# Patient Record
Sex: Male | Born: 1945 | Race: White | Hispanic: No | Smoking: Never smoker
Health system: Southern US, Community
[De-identification: ages and names within clinical notes are randomized; demographics above are authoritative.]

## PROBLEM LIST (undated history)

## (undated) DIAGNOSIS — R51 Headache: Secondary | ICD-10-CM

## (undated) DIAGNOSIS — M549 Dorsalgia, unspecified: Secondary | ICD-10-CM

## (undated) DIAGNOSIS — H548 Legal blindness, as defined in USA: Secondary | ICD-10-CM

## (undated) DIAGNOSIS — D496 Neoplasm of unspecified behavior of brain: Secondary | ICD-10-CM

## (undated) DIAGNOSIS — G8929 Other chronic pain: Secondary | ICD-10-CM

## (undated) DIAGNOSIS — I10 Essential (primary) hypertension: Secondary | ICD-10-CM

## (undated) DIAGNOSIS — D4959 Neoplasm of unspecified behavior of other genitourinary organ: Secondary | ICD-10-CM

## (undated) HISTORY — PX: BRAIN SURGERY: SHX531

## (undated) HISTORY — PX: TONSILLECTOMY: SUR1361

## (undated) HISTORY — PX: EYE SURGERY: SHX253

---

## 2004-04-20 ENCOUNTER — Ambulatory Visit (HOSPITAL_COMMUNITY): Admission: RE | Admit: 2004-04-20 | Discharge: 2004-04-20 | Payer: Self-pay | Admitting: Pulmonary Disease

## 2005-10-24 ENCOUNTER — Ambulatory Visit (HOSPITAL_COMMUNITY): Admission: RE | Admit: 2005-10-24 | Discharge: 2005-10-24 | Payer: Self-pay | Admitting: Pulmonary Disease

## 2006-10-27 ENCOUNTER — Ambulatory Visit (HOSPITAL_COMMUNITY): Admission: RE | Admit: 2006-10-27 | Discharge: 2006-10-27 | Payer: Self-pay | Admitting: Pulmonary Disease

## 2006-11-24 ENCOUNTER — Ambulatory Visit (HOSPITAL_COMMUNITY): Admission: RE | Admit: 2006-11-24 | Discharge: 2006-11-24 | Payer: Self-pay | Admitting: Pulmonary Disease

## 2008-11-09 ENCOUNTER — Ambulatory Visit (HOSPITAL_COMMUNITY): Admission: RE | Admit: 2008-11-09 | Discharge: 2008-11-09 | Payer: Self-pay | Admitting: Pulmonary Disease

## 2009-12-11 ENCOUNTER — Ambulatory Visit (HOSPITAL_COMMUNITY): Admission: RE | Admit: 2009-12-11 | Discharge: 2009-12-11 | Payer: Self-pay | Admitting: Family Medicine

## 2009-12-21 ENCOUNTER — Ambulatory Visit (HOSPITAL_COMMUNITY): Admission: RE | Admit: 2009-12-21 | Discharge: 2009-12-21 | Payer: Self-pay | Admitting: Family Medicine

## 2009-12-22 ENCOUNTER — Emergency Department (HOSPITAL_COMMUNITY): Admission: EM | Admit: 2009-12-22 | Discharge: 2009-12-22 | Payer: Self-pay | Admitting: Orthopedic Surgery

## 2009-12-25 ENCOUNTER — Ambulatory Visit (HOSPITAL_COMMUNITY): Admission: RE | Admit: 2009-12-25 | Discharge: 2009-12-25 | Payer: Self-pay | Admitting: Family Medicine

## 2010-02-22 ENCOUNTER — Emergency Department (HOSPITAL_COMMUNITY): Admission: EM | Admit: 2010-02-22 | Discharge: 2010-02-22 | Payer: Self-pay | Admitting: Emergency Medicine

## 2010-03-03 ENCOUNTER — Emergency Department (HOSPITAL_COMMUNITY): Admission: EM | Admit: 2010-03-03 | Discharge: 2010-03-03 | Payer: Self-pay | Admitting: Emergency Medicine

## 2010-06-12 ENCOUNTER — Ambulatory Visit: Admission: RE | Admit: 2010-06-12 | Discharge: 2010-07-19 | Payer: Self-pay | Admitting: Radiation Oncology

## 2010-11-18 ENCOUNTER — Encounter: Payer: Self-pay | Admitting: Pulmonary Disease

## 2011-01-15 LAB — CBC
HCT: 40.8 % (ref 39.0–52.0)
Hemoglobin: 14.5 g/dL (ref 13.0–17.0)
RBC: 4.49 MIL/uL (ref 4.22–5.81)
WBC: 10.2 10*3/uL (ref 4.0–10.5)

## 2011-01-15 LAB — BASIC METABOLIC PANEL
Chloride: 106 mEq/L (ref 96–112)
GFR calc non Af Amer: >60 mL/min (ref >60)
Potassium: 3.2 mEq/L — ABNORMAL LOW (ref 3.5–5.1)
Sodium: 138 mEq/L (ref 135–145)

## 2011-01-15 LAB — DIFFERENTIAL
Eosinophils Relative: 1 % (ref 0–5)
Lymphocytes Relative: 38 % (ref 12–46)
Lymphs Abs: 3.9 10*3/uL (ref 0.7–4.0)
Monocytes Absolute: 0.7 10*3/uL (ref 0.1–1.0)
Monocytes Relative: 7 % (ref 3–12)

## 2011-01-15 LAB — GLUCOSE, CAPILLARY: Glucose-Capillary: 111 mg/dL — ABNORMAL HIGH (ref 70–99)

## 2011-03-03 ENCOUNTER — Emergency Department (HOSPITAL_COMMUNITY)
Admission: EM | Admit: 2011-03-03 | Discharge: 2011-03-03 | Disposition: A | Discharge status: Home / Self Care | Payer: Medicare Other | Attending: Emergency Medicine | Admitting: Emergency Medicine

## 2011-03-03 DIAGNOSIS — E119 Type 2 diabetes mellitus without complications: Secondary | ICD-10-CM | POA: Insufficient documentation

## 2011-03-03 DIAGNOSIS — W57XXXA Bitten or stung by nonvenomous insect and other nonvenomous arthropods, initial encounter: Secondary | ICD-10-CM | POA: Insufficient documentation

## 2011-03-03 DIAGNOSIS — S60469A Insect bite (nonvenomous) of unspecified finger, initial encounter: Secondary | ICD-10-CM | POA: Insufficient documentation

## 2011-03-03 DIAGNOSIS — H548 Legal blindness, as defined in USA: Secondary | ICD-10-CM | POA: Insufficient documentation

## 2011-03-03 DIAGNOSIS — E7800 Pure hypercholesterolemia, unspecified: Secondary | ICD-10-CM | POA: Insufficient documentation

## 2011-03-03 DIAGNOSIS — Z79899 Other long term (current) drug therapy: Secondary | ICD-10-CM | POA: Insufficient documentation

## 2011-03-03 DIAGNOSIS — Z970 Presence of artificial eye: Secondary | ICD-10-CM | POA: Insufficient documentation

## 2011-04-22 ENCOUNTER — Other Ambulatory Visit (HOSPITAL_COMMUNITY): Payer: Self-pay | Admitting: Family Medicine

## 2011-04-22 DIAGNOSIS — M79661 Pain in right lower leg: Secondary | ICD-10-CM

## 2011-04-23 ENCOUNTER — Ambulatory Visit (HOSPITAL_COMMUNITY): Payer: Self-pay

## 2011-04-23 ENCOUNTER — Ambulatory Visit (HOSPITAL_COMMUNITY)
Admission: RE | Admit: 2011-04-23 | Discharge: 2011-04-23 | Disposition: A | Discharge status: Home / Self Care | Payer: Medicare Other | Source: Ambulatory Visit | Attending: Family Medicine | Admitting: Family Medicine

## 2011-04-23 DIAGNOSIS — M79661 Pain in right lower leg: Secondary | ICD-10-CM

## 2011-05-09 ENCOUNTER — Encounter: Payer: Self-pay | Admitting: *Deleted

## 2011-05-09 ENCOUNTER — Emergency Department (HOSPITAL_COMMUNITY)
Admission: EM | Admit: 2011-05-09 | Discharge: 2011-05-09 | Disposition: A | Discharge status: Home / Self Care | Payer: Medicare Other | Attending: Emergency Medicine | Admitting: Emergency Medicine

## 2011-05-09 DIAGNOSIS — M542 Cervicalgia: Secondary | ICD-10-CM | POA: Insufficient documentation

## 2011-05-09 DIAGNOSIS — R51 Headache: Secondary | ICD-10-CM | POA: Insufficient documentation

## 2011-05-09 DIAGNOSIS — D4959 Neoplasm of unspecified behavior of other genitourinary organ: Secondary | ICD-10-CM | POA: Insufficient documentation

## 2011-05-09 DIAGNOSIS — E119 Type 2 diabetes mellitus without complications: Secondary | ICD-10-CM | POA: Insufficient documentation

## 2011-05-09 DIAGNOSIS — I10 Essential (primary) hypertension: Secondary | ICD-10-CM | POA: Insufficient documentation

## 2011-05-09 DIAGNOSIS — D496 Neoplasm of unspecified behavior of brain: Secondary | ICD-10-CM | POA: Insufficient documentation

## 2011-05-09 DIAGNOSIS — G8929 Other chronic pain: Secondary | ICD-10-CM | POA: Insufficient documentation

## 2011-05-09 HISTORY — DX: Neoplasm of unspecified behavior of other genitourinary organ: D49.59

## 2011-05-09 HISTORY — DX: Other chronic pain: G89.29

## 2011-05-09 HISTORY — DX: Essential (primary) hypertension: I10

## 2011-05-09 HISTORY — DX: Neoplasm of unspecified behavior of brain: D49.6

## 2011-05-09 MED ORDER — METHOCARBAMOL 500 MG PO TABS: 500.0000 mg | ORAL_TABLET | Freq: Two times a day (BID) | ORAL | Status: AC

## 2011-05-09 NOTE — ED Notes (Signed)
C/o pain to right side of face, headache, bil shoulder pain, bil hip pain, neck pain x 6 months.  States has been told has damaged nerve in neck.

## 2011-05-09 NOTE — ED Notes (Signed)
Chronic pain sates he has had pain for years, has multiple complaints

## 2011-05-09 NOTE — ED Provider Notes (Addendum)
History     Chief Complaint  Patient presents with  . Headache  . Neck Pain   HPI Comments: Pt with chronic pain, followed by Dr. Megan Mans, last seen 3 days ago and given new fentanyl patches. Also takes norco and tylenol daily. Pt c/o chronic ha, face pain, shoulder pain, neck pain, back pain, and foot pain; most pain worse on the left side. Pain has been present >6 months but pt states it has become worse over the past 3 weeks. No numbness, tingling, weakness, cp, sob, or abd pain.  Patient is a 65 y.o. male presenting with headaches and neck pain. The history is provided by the patient.  Headache  This is a chronic problem. The problem occurs constantly. The problem has not changed since onset.Pain location: entire head and face. Pertinent negatives include no anorexia, no fever, no malaise/fatigue, no chest pressure, no palpitations, no syncope, no shortness of breath, no nausea and no vomiting.  Neck Pain  Associated symptoms include headaches. Pertinent negatives include no syncope.    Past Medical History  Diagnosis Date  . Prostate tumor   . Brain tumor   . Chronic pain   . Hypertension   . Diabetes mellitus     Past Surgical History  Procedure Date  . Brain surgery   . Eye surgery   . Tonsillectomy     History reviewed. No pertinent family history.  History  Substance Use Topics  . Smoking status: Never Smoker   . Smokeless tobacco: Not on file  . Alcohol Use: Yes     occasional      Review of Systems  Constitutional: Negative for fever and malaise/fatigue.  HENT: Positive for neck pain.   Respiratory: Negative for shortness of breath.   Cardiovascular: Negative for palpitations and syncope.  Gastrointestinal: Negative for nausea, vomiting and anorexia.  Musculoskeletal: Positive for myalgias and back pain.  Neurological: Positive for headaches. Negative for dizziness.  All other systems reviewed and are negative.    Physical Exam  BP 147/77  Pulse  88  Temp(Src) 99 F (37.2 C) (Oral)  Resp 18  Ht 6\' 3"  (1.905 m)  Wt 226 lb 12.8 oz (102.876 kg)  BMI 28.35 kg/m2  SpO2 96%  Physical Exam  Nursing note and vitals reviewed. Constitutional: He is oriented to person, place, and time. He appears well-developed and well-nourished. No distress.  HENT:  Head: Normocephalic.  Mouth/Throat: Mucous membranes are normal.  Eyes:       Normal appearance  Neck: Normal range of motion. Neck supple.  Cardiovascular: Normal rate and regular rhythm.  Exam reveals no gallop and no friction rub.   No murmur heard. Pulmonary/Chest: Effort normal and breath sounds normal. He has no wheezes. He has no rhonchi. He has no rales.  Abdominal: Soft. He exhibits no distension. There is no tenderness.  Musculoskeletal: Normal range of motion.       Slight upper thoracic paraspinal tenderness; Normal appearance  Neurological: He is alert and oriented to person, place, and time.       Motor intact in all extremities; grip strength and BLE strength equal and 5/5; normal knee jerk reflex bilaterally  Skin: Skin is warm and dry. No rash noted.       Color normal  Psychiatric: He has a normal mood and affect.    ED Course  Procedures Written by Enos Fling acting as scribe for Dr. Freida Busman.  MDM Pt given rx for muscle relaxant, will f/u dr. Megan Mans  this week  .I personally performed the services described in this documentation, which was scribed in my presence. The recorded information has been reviewed and considered. No att. providers found     Toy Baker, MD 05/09/11 1629  Toy Baker, MD 06/08/11 [phone]

## 2011-06-04 ENCOUNTER — Emergency Department (HOSPITAL_COMMUNITY)
Admission: EM | Admit: 2011-06-04 | Discharge: 2011-06-05 | Disposition: A | Discharge status: Home / Self Care | Payer: Medicare Other | Attending: Emergency Medicine | Admitting: Emergency Medicine

## 2011-06-04 ENCOUNTER — Emergency Department (HOSPITAL_COMMUNITY): Payer: Medicare Other

## 2011-06-04 ENCOUNTER — Encounter (HOSPITAL_COMMUNITY): Payer: Self-pay

## 2011-06-04 DIAGNOSIS — I10 Essential (primary) hypertension: Secondary | ICD-10-CM | POA: Insufficient documentation

## 2011-06-04 DIAGNOSIS — E119 Type 2 diabetes mellitus without complications: Secondary | ICD-10-CM | POA: Insufficient documentation

## 2011-06-04 DIAGNOSIS — G8929 Other chronic pain: Secondary | ICD-10-CM | POA: Insufficient documentation

## 2011-06-04 DIAGNOSIS — F419 Anxiety disorder, unspecified: Secondary | ICD-10-CM

## 2011-06-04 DIAGNOSIS — F411 Generalized anxiety disorder: Secondary | ICD-10-CM | POA: Insufficient documentation

## 2011-06-04 DIAGNOSIS — G9389 Other specified disorders of brain: Secondary | ICD-10-CM | POA: Insufficient documentation

## 2011-06-04 LAB — CBC
Hemoglobin: 12 g/dL — ABNORMAL LOW (ref 13.0–17.0)
MCH: 31.7 pg (ref 26.0–34.0)
MCHC: 33.6 g/dL (ref 30.0–36.0)

## 2011-06-04 LAB — HEPATIC FUNCTION PANEL
Indirect Bilirubin: 0.1 mg/dL — ABNORMAL LOW (ref 0.3–0.9)
Total Protein: 7.5 g/dL (ref 6.0–8.3)

## 2011-06-04 LAB — BASIC METABOLIC PANEL
CO2: 26 mEq/L (ref 19–32)
Chloride: 99 mEq/L (ref 96–112)
Glucose, Bld: 160 mg/dL — ABNORMAL HIGH (ref 70–99)
Sodium: 140 mEq/L (ref 135–145)

## 2011-06-04 LAB — DIFFERENTIAL
Basophils Absolute: 0 10*3/uL (ref 0.0–0.1)
Monocytes Absolute: 0.4 10*3/uL (ref 0.1–1.0)
Neutrophils Relative %: 66 % (ref 43–77)

## 2011-06-04 LAB — URINALYSIS, ROUTINE W REFLEX MICROSCOPIC
Glucose, UA: NEGATIVE mg/dL
Hgb urine dipstick: NEGATIVE
Leukocytes, UA: NEGATIVE
pH: 5.5 (ref 5.0–8.0)

## 2011-06-04 LAB — ETHANOL: Alcohol, Ethyl (B): <11 mg/dL (ref 0–11)

## 2011-06-04 LAB — PROTIME-INR
INR: 0.95 (ref 0.00–1.49)
Prothrombin Time: 12.9 seconds (ref 11.6–15.2)

## 2011-06-04 MED ORDER — LORAZEPAM 1 MG PO TABS
1.0000 mg | ORAL_TABLET | Freq: Once | ORAL | Status: AC
  Administered 2011-06-04: 1 mg via ORAL
  Filled 2011-06-04: qty 1

## 2011-06-04 NOTE — ED Notes (Signed)
Pt lives at home, called ems d/t anxiety/panic attack.    Pt denies complaints, states "I am just nervous"   Pt unable to sit still on the bed, pacing the room,  Appears anxious.

## 2011-06-04 NOTE — ED Provider Notes (Signed)
Scribed for Dr. Manus Gunning, the patient was seen in room 18. This chart was scribed by Hillery Hunter. This patient's care was started at 21:30.    History     CSN: [id number] Arrival date & time: 06/04/2011  8:04 PM  Chief Complaint  Patient presents with  . Panic Attack   Patient is a 65 y.o. male presenting with anxiety. The history is provided by the patient.  Anxiety Pertinent negatives include no chest pain (described as "stiff"), no abdominal pain and no headaches ("tightness").   Patient states he was anxious and called an ambulance to be seen in the ED. He complaints of a "tight" headache and some recent dental problem with extraction but states he has no current tooth pain. He denies SI, HI and states that he was only nervous and sweaty.   Past Medical History  Diagnosis Date  . Prostate tumor   . Brain tumor   . Chronic pain   . Hypertension   . Diabetes mellitus     Past Surgical History  Procedure Date  . Brain surgery   . Eye surgery   . Tonsillectomy     History reviewed. No pertinent family history.  History  Substance Use Topics  . Smoking status: Never Smoker   . Smokeless tobacco: Not on file  . Alcohol Use: Yes     occasional      Review of Systems  Constitutional: Negative for fever.  HENT: Negative for neck pain and neck stiffness.   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 Vision not different from baseline   Cardiovascular: Negative for chest pain (described as "stiff").  Gastrointestinal: Negative for abdominal pain.  Musculoskeletal: Negative for back pain.  Neurological: Negative for dizziness, syncope and headaches ("tightness").  Psychiatric/Behavioral: Negative for suicidal ideas, hallucinations, behavioral problems, confusion, self-injury and agitation. The patient is nervous/anxious.   All other systems reviewed and are negative.    Physical Exam  BP 139/74   Pulse 89  Temp(Src) 98.9 F (37.2 C) (Oral)  Resp 16  SpO2 96%  Physical Exam  Nursing note and vitals reviewed. Constitutional: He is oriented to person, place, and time. No distress.  HENT:  Mouth/Throat: Oropharynx is clear and moist. Abnormal dentition (poor dentition throughout). No dental abscesses.       Poor dentition throughout, floor of mouth soft, no abscess  Eyes:       Unchanged from baseline  Neck: Neck supple.  Cardiovascular: Normal heart sounds.   Pulmonary/Chest: Breath sounds normal. No respiratory distress.  Abdominal: He exhibits no distension. There is no tenderness.  Musculoskeletal: He exhibits no tenderness.  Neurological: He is alert and oriented to person, place, and time.  Skin: Skin is warm and dry.  Psychiatric: His behavior is normal. Thought content normal. His mood appears anxious. His speech is not slurred. He is not aggressive. He does not exhibit a depressed mood. He expresses no homicidal and no suicidal ideation. He expresses no suicidal plans and no homicidal plans.    ED Course  Procedures  OTHER DATA REVIEWED: Nursing notes, vital signs, and past medical records reviewed.    DIAGNOSTIC STUDIES: Oxygen Saturation is 96% on room air, normal by my interpretation.     LABS / RADIOLOGY:  Results for orders placed during the hospital encounter of 06/04/11  CBC      Component Value Range   WBC 7.8  4.0 -  10.5 (K/uL)   RBC 3.79 (*) 4.22 - 5.81 (MIL/uL)   Hemoglobin 12.0 (*) 13.0 - 17.0 (g/dL)   HCT 16.1 (*) 09.6 - 52.0 (%)   MCV 94.2  78.0 - 100.0 (fL)   MCH 31.7  26.0 - 34.0 (pg)   MCHC 33.6  30.0 - 36.0 (g/dL)   RDW 04.5  40.9 - 81.1 (%)   Platelets 257  150 - 400 (K/uL)  DIFFERENTIAL      Component Value Range   Neutrophils Relative 66  43 - 77 (%)   Neutro Abs 5.1  1.7 - 7.7 (K/uL)   Lymphocytes Relative 28  12 - 46 (%)   Lymphs Abs 2.2  0.7 - 4.0 (K/uL)   Monocytes Relative 5  3 - 12 (%)   Monocytes Absolute 0.4  0.1 - 1.0  (K/uL)   Eosinophils Relative 1  0 - 5 (%)   Eosinophils Absolute 0.0  0.0 - 0.7 (K/uL)   Basophils Relative 0  0 - 1 (%)   Basophils Absolute 0.0  0.0 - 0.1 (K/uL)  BASIC METABOLIC PANEL      Component Value Range   Sodium 140  135 - 145 (mEq/L)   Potassium 3.7  3.5 - 5.1 (mEq/L)   Chloride 99  96 - 112 (mEq/L)   CO2 26  19 - 32 (mEq/L)   Glucose, Bld 160 (*) 70 - 99 (mg/dL)   BUN 26 (*) 6 - 23 (mg/dL)   Creatinine, Ser 9.14  0.50 - 1.35 (mg/dL)   Calcium 78.2  8.4 - 10.5 (mg/dL)   GFR calc non Af Amer >60  >60 (mL/min)   GFR calc Af Amer >60  >60 (mL/min)  URINALYSIS, ROUTINE W REFLEX MICROSCOPIC      Component Value Range   Color, Urine YELLOW  YELLOW    Appearance CLEAR  CLEAR    Specific Gravity, Urine 1.020  1.005 - 1.030    pH 5.5  5.0 - 8.0    Glucose, UA NEGATIVE  NEGATIVE (mg/dL)   Hgb urine dipstick NEGATIVE  NEGATIVE    Bilirubin Urine NEGATIVE  NEGATIVE    Ketones, ur NEGATIVE  NEGATIVE (mg/dL)   Protein, ur NEGATIVE  NEGATIVE (mg/dL)   Urobilinogen, UA 0.2  0.0 - 1.0 (mg/dL)   Nitrite NEGATIVE  NEGATIVE    Leukocytes, UA NEGATIVE  NEGATIVE   ETHANOL      Component Value Range   Alcohol, Ethyl (B) <11  0 - 11 (mg/dL)  PROTIME-INR      Component Value Range   Prothrombin Time 12.9  11.6 - 15.2 (seconds)   INR 0.95  0.00 - 1.49   HEPATIC FUNCTION PANEL      Component Value Range   Total Protein 7.5  6.0 - 8.3 (g/dL)   Albumin 3.7  3.5 - 5.2 (g/dL)   AST 14  0 - 37 (U/L)   ALT 13  0 - 53 (U/L)   Alkaline Phosphatase 68  39 - 117 (U/L)   Total Bilirubin 0.2 (*) 0.3 - 1.2 (mg/dL)   Bilirubin, Direct 0.1  0.0 - 0.3 (mg/dL)   Indirect Bilirubin 0.1 (*) 0.3 - 0.9 (mg/dL)     ED COURSE / COORDINATION OF CARE: 21:45  Orders Placed This Encounter  Procedures  . CT Head Wo Contrast  . CBC  . Differential  . Basic metabolic panel  . Urinalysis with microscopic  . Ethanol  . Protime-INR  . Hepatic function panel  MDM: Differential Diagnosis:  "anxiety" Back to baseline now.  Denies SI, HI, hallucinations Labs unremarkable and CT head stable,   IMPRESSION: Diagnoses that have been ruled out:  Diagnoses that are still under consideration:  Final diagnoses:  Anxiety     PLAN:  Home The patient is to return the emergency department if there is any worsening of symptoms. I have reviewed the discharge instructions with the patient   CONDITION ON DISCHARGE: Good   Scribe Attestation I personally performed the services described in this documentation, which was scribed in my presence.  The recorded information has been reviewed and considered.   Glynn Octave, MD 06/05/11 [phone]

## 2011-07-31 ENCOUNTER — Emergency Department (HOSPITAL_COMMUNITY)
Admission: EM | Admit: 2011-07-31 | Discharge: 2011-07-31 | Disposition: A | Discharge status: Home / Self Care | Payer: Medicare Other | Attending: Emergency Medicine | Admitting: Emergency Medicine

## 2011-07-31 ENCOUNTER — Encounter (HOSPITAL_COMMUNITY): Payer: Self-pay

## 2011-07-31 ENCOUNTER — Emergency Department (HOSPITAL_COMMUNITY): Payer: Medicare Other

## 2011-07-31 DIAGNOSIS — K1370 Unspecified lesions of oral mucosa: Secondary | ICD-10-CM | POA: Insufficient documentation

## 2011-07-31 DIAGNOSIS — T23202A Burn of second degree of left hand, unspecified site, initial encounter: Secondary | ICD-10-CM

## 2011-07-31 DIAGNOSIS — M25559 Pain in unspecified hip: Secondary | ICD-10-CM | POA: Insufficient documentation

## 2011-07-31 DIAGNOSIS — T23209A Burn of second degree of unspecified hand, unspecified site, initial encounter: Secondary | ICD-10-CM | POA: Insufficient documentation

## 2011-07-31 DIAGNOSIS — S7000XA Contusion of unspecified hip, initial encounter: Secondary | ICD-10-CM | POA: Insufficient documentation

## 2011-07-31 DIAGNOSIS — X118XXA Contact with other hot tap-water, initial encounter: Secondary | ICD-10-CM | POA: Insufficient documentation

## 2011-07-31 DIAGNOSIS — S7001XA Contusion of right hip, initial encounter: Secondary | ICD-10-CM

## 2011-07-31 DIAGNOSIS — W19XXXA Unspecified fall, initial encounter: Secondary | ICD-10-CM | POA: Insufficient documentation

## 2011-07-31 DIAGNOSIS — R51 Headache: Secondary | ICD-10-CM

## 2011-07-31 DIAGNOSIS — Z79899 Other long term (current) drug therapy: Secondary | ICD-10-CM | POA: Insufficient documentation

## 2011-07-31 MED ORDER — SILVER SULFADIAZINE 1 % EX CREA
TOPICAL_CREAM | Freq: Once | CUTANEOUS | Status: AC
  Administered 2011-07-31: 1 via TOPICAL
  Filled 2011-07-31: qty 50

## 2011-07-31 MED ORDER — NAPROXEN 500 MG PO TABS: 500.0000 mg | ORAL_TABLET | Freq: Two times a day (BID) | ORAL | Status: DC

## 2011-07-31 MED ORDER — KETOROLAC TROMETHAMINE 60 MG/2ML IM SOLN
60.0000 mg | Freq: Once | INTRAMUSCULAR | Status: AC
  Administered 2011-07-31: 60 mg via INTRAMUSCULAR
  Filled 2011-07-31: qty 2

## 2011-07-31 MED ORDER — SILVER SULFADIAZINE 1 % EX CREA: TOPICAL_CREAM | Freq: Every day | CUTANEOUS | Status: DC

## 2011-07-31 NOTE — ED Notes (Signed)
Patient with no complaints at this time. Respirations even and unlabored. Skin warm/dry. Discharge instructions reviewed with patient at this time. Patient given opportunity to voice concerns/ask questions. Patient discharged at this time and left Emergency Department via wheelchair. Patient to waiting room to await cab home.

## 2011-07-31 NOTE — ED Notes (Signed)
1. Headache for 2 days 2. Mouth sore for several days 3. Right hip pain since falling 2 days ago

## 2011-07-31 NOTE — ED Notes (Signed)
Patient reporting that he has no ride home. Patient states the only person who could come get him cannot be here before late afternoon.   RN supervisor made aware of situation and will call patient a cab.
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
## 2011-07-31 NOTE — ED Notes (Signed)
Silvadene applied to left hand wound, wound dressed and bandaged.

## 2011-08-07 ENCOUNTER — Encounter (HOSPITAL_COMMUNITY): Payer: Self-pay | Admitting: Emergency Medicine

## 2011-08-07 ENCOUNTER — Emergency Department (HOSPITAL_COMMUNITY)
Admission: EM | Admit: 2011-08-07 | Discharge: 2011-08-07 | Disposition: A | Discharge status: Home / Self Care | Payer: Medicare Other | Attending: Emergency Medicine | Admitting: Emergency Medicine

## 2011-08-07 DIAGNOSIS — E119 Type 2 diabetes mellitus without complications: Secondary | ICD-10-CM | POA: Insufficient documentation

## 2011-08-07 DIAGNOSIS — R51 Headache: Secondary | ICD-10-CM | POA: Insufficient documentation

## 2011-08-07 DIAGNOSIS — I10 Essential (primary) hypertension: Secondary | ICD-10-CM | POA: Insufficient documentation

## 2011-08-07 DIAGNOSIS — G8929 Other chronic pain: Secondary | ICD-10-CM

## 2011-08-07 MED ORDER — METOCLOPRAMIDE HCL 5 MG/ML IJ SOLN
10.0000 mg | Freq: Once | INTRAMUSCULAR | Status: AC
  Administered 2011-08-07: 10 mg via INTRAMUSCULAR
  Filled 2011-08-07: qty 2

## 2011-08-07 MED ORDER — OXYCODONE-ACETAMINOPHEN 5-325 MG PO TABS
2.0000 | ORAL_TABLET | Freq: Once | ORAL | Status: AC
  Administered 2011-08-07: 2 via ORAL
  Filled 2011-08-07: qty 2

## 2011-08-07 MED ORDER — DEXAMETHASONE SODIUM PHOSPHATE 10 MG/ML IJ SOLN
10.0000 mg | Freq: Once | INTRAMUSCULAR | Status: AC
  Administered 2011-08-07: 10 mg via INTRAMUSCULAR
  Filled 2011-08-07: qty 1

## 2011-08-07 MED ORDER — DIPHENHYDRAMINE HCL 25 MG PO CAPS
50.0000 mg | ORAL_CAPSULE | Freq: Once | ORAL | Status: AC
  Administered 2011-08-07: 50 mg via ORAL
  Filled 2011-08-07: qty 2

## 2011-08-07 MED ORDER — KETOROLAC TROMETHAMINE 30 MG/ML IJ SOLN
30.0000 mg | Freq: Once | INTRAMUSCULAR | Status: AC
  Administered 2011-08-07: 30 mg via INTRAMUSCULAR
  Filled 2011-08-07: qty 1

## 2011-08-07 NOTE — ED Notes (Signed)
Pt c/o pain all over his head and his mouth since yesterday. Pt states he has an appt with dr. Gerilyn Pilgrim today.

## 2011-08-07 NOTE — ED Provider Notes (Signed)
History   Chart scribed for Levi Morris. Levi Mcwethy, MD by Enos Fling; the patient was seen in room APA04/APA04; this patient's care was started at 9:42 AM.    CSN: [id number] Arrival date & time: 08/07/2011  9:12 AM  Chief Complaint  Patient presents with  . Headache   HPI Levi Morris is a 65 y.o. male who presents to the Emergency Department complaining of headache. Pt states he has severe pain in his head, radiating to his face and eyes. Pt states this pain is chronic and "keeps getting worse." He has appointment with Dr. Gerilyn Morris today who is evaluating this pain. No nausea or vomiting. Pt still eating normally. No change in vision, neck pain, fever, weakness, numbness, or tingling. 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  PCP Dr. Renard Morris  Past Medical History  Diagnosis Date  . Prostate tumor   . Brain tumor   . Chronic pain   . Hypertension   . Diabetes mellitus     Past Surgical History  Procedure Date  . Brain surgery   . Eye surgery   . Tonsillectomy     History reviewed. No pertinent family history.  History  Substance Use Topics  . Smoking status: Never Smoker   . Smokeless tobacco: Not on file  . Alcohol Use: Yes     occasional      Review of Systems 10 Systems reviewed and are negative for acute change except as noted in the HPI.  Allergies  Review of patient's allergies indicates no known allergies.  Home Medications   Current Outpatient Rx  Name Route Sig Dispense Refill  . ACETAMINOPHEN 500 MG PO TABS Oral Take 500 mg by mouth daily as needed. For pain     . CELEBREX PO Oral Take by mouth.     . CELECOXIB 200 MG PO CAPS Oral Take 200 mg by mouth daily.      . FENTANYL 75 MCG/HR TD PT72 Transdermal Place 1 patch onto the skin every 3 (three) days.      Marland Kitchen FLUOXETINE HCL 10 MG PO CAPS Oral Take 10 mg by mouth daily.      Marland Kitchen NEURONTIN PO Oral Take by mouth.      Marland Kitchen GABAPENTIN 300 MG PO CAPS Oral Take 600 mg by mouth 3  (three) times daily.      Marland Kitchen HYDROCODONE-ACETAMINOPHEN 5-325 MG PO TABS Oral Take 1 tablet by mouth every 6 (six) hours as needed.      Marland Kitchen LORAZEPAM 1 MG PO TABS Oral Take 1 mg by mouth every 4 (four) hours as needed. For nerves     . METFORMIN HCL 500 MG PO TABS Oral Take 1,000 mg by mouth 2 (two) times daily with a meal.      . MUPIROCIN 2 % EX OINT Topical Apply 1 application topically daily as needed.      Marland Kitchen NAPROXEN 500 MG PO TABS Oral Take 1 tablet (500 mg total) by mouth 2 (two) times daily. 30 tablet 0  . PIOGLITAZONE HCL 30 MG PO TABS Oral Take 30 mg by mouth daily.      . ACTOS PO Oral Take by mouth.      Levi Morris OP Ophthalmic Apply 1 drop to eye daily.      Marland Kitchen PREDNISOLONE ACETATE 1 % OP SUSP Left Eye Place 1 drop into the left eye 3 (three) times daily. As directed     . SILVER SULFADIAZINE 1 % EX  CREA Topical Apply topically daily. 50 g 2  . TRIAMTERENE-HCTZ 37.5-25 MG PO CAPS Oral Take 1 capsule by mouth daily as needed. For swelling     . ZOLPIDEM TARTRATE 10 MG PO TABS Oral Take 10 mg by mouth at bedtime as needed. For sleep       BP 128/87  Pulse 101  Temp 98.1 F (36.7 C)  Resp 18  Ht 6\' 3"  (1.905 m)  Wt 245 lb (111.131 kg)  BMI 30.62 kg/m2  SpO2 99%  Physical Exam  Nursing note and vitals reviewed. Constitutional: He is oriented to person, place, and time. He appears well-developed and well-nourished. No distress.  HENT:  Head: Atraumatic.  Right Ear: External ear normal.  Left Ear: External ear normal.  Nose: Nose normal.       Tenderness base of occiput of left side  Eyes:       Right eye prosthesis  Neck: Neck supple.       No neck stiffness  Cardiovascular: Normal rate and regular rhythm.   Pulmonary/Chest: Effort normal and breath sounds normal.  Abdominal: Soft. There is no tenderness.  Musculoskeletal: Normal range of motion.       Normal pulses  Neurological: He is alert and oriented to person, place, and time.       Motor intact in all  extremities  Skin: Skin is warm and dry.       Color normal  Psychiatric: He has a normal mood and affect.    ED Course  Procedures - none  Labs Reviewed - No data to display No results found.   MDM  Pt is able to ambulate in the ED, he reprots chronic HA and not worse or different, no fever here.  Given HA cocktail and pt reports no better.  I doubt tumor recurrence, bleeding and no evidence of CVA on exam.  Will give some percocet and d/c home so he can follow up with Dr. Gerilyn Morris at 2 PM today.     IMPRESSION: 1. Chronic headache     SCRIBE ATTESTATION: I personally performed the services described in this documentation, which was scribed in my presence. The recorded information has been reviewed and considered. Levi Morris Y.       Levi Morris. Shanira Tine, MD 08/07/11 1118

## 2011-08-07 NOTE — ED Notes (Signed)
In to discharge pt . States he cannot keep his appoint with [place] today. States he does not have a way. Pt the states he is in so much pain he cannot get there. Pt states he has no way home today. req his wife be called. Number dialed for pt and he refused to speak with his wife. States he was not going to talk with her he was just drop the phone in the floor

## 2011-08-07 NOTE — ED Notes (Signed)
Dr. Gerilyn Pilgrim Paged.

## 2011-08-07 NOTE — ED Notes (Signed)
Pt c/o headache, neck pain and aching all over. Alert and oriented x 3. Skin warm and dry. Color pink. Breath sounds clear and equal bilaterally. Able to stand and get on stretcher without assistance.

## 2011-08-07 NOTE — Discharge Instructions (Signed)
Chronic Pain Discharge Instructions  °Emergency care providers appreciate that many patients coming to us are in severe pain and we wish to address their pain in the safest, most responsible manner.  It is important to recognize however, that the proper treatment of chronic pain differs from that of the pain of injuries and acute illnesses.  Our goal is to provide quality, safe, personalized care and we thank you for giving us the opportunity to serve you. °The use of narcotics and related agents for chronic pain syndromes may lead to additional physical and psychological problems.  Nearly as many people die from prescription narcotics each year as die from car crashes.  Additionally, this risk is increased if such prescriptions are obtained from a variety of sources.  Therefore, only your primary care physician or a pain management specialist is able to safely treat such syndromes with narcotic medications long-term.   ° °Documentation revealing such prescriptions have been sought from multiple sources may prohibit us from providing a refill or different narcotic medication.  Your name may be checked first through the [facility] Controlled Substances Reporting System.  This database is a record of controlled substance medication prescriptions that the patient has received.  This has been established by [facility] in an effort to eliminate the dangerous, and often life threatening, practice of obtaining multiple prescriptions from different medical providers.  ° °If you have a chronic pain syndrome (i.e. chronic headaches, recurrent back or neck pain, dental pain, abdominal or pelvis pain without a specific diagnosis, or neuropathic pain such as fibromyalgia) or recurrent visits for the same condition without an acute diagnosis, you may be treated with non-narcotics and other non-addictive medicines.  Allergic reactions or negative side effects that may be reported by a patient to such medications will not  typically lead to the use of a narcotic analgesic or other controlled substance as an alternative. °  °Patients managing chronic pain with a personal physician should have provisions in place for breakthrough pain.  If you are in crisis, you should call your physician.  If your physician directs you to the emergency department, please have the doctor call and speak to our attending physician concerning your care. °  °When patients come to the Emergency Department (ED) with acute medical conditions in which the Emergency Department physician feels appropriate to prescribe narcotic or sedating pain medication, the physician will prescribe these in very limited quantities.  The amount of these medications will last only until you can see your primary care physician in his/her office.  Any patient who returns to the ED seeking refills should expect only non-narcotic pain medications.  ° °In the event of an acute medical condition exists and the emergency physician feels it is necessary that the patient be given a narcotic or sedating medication -  a responsible adult driver should be present in the room prior to the medication being given by the nurse. °  °Prescriptions for narcotic or sedating medications that have been lost, stolen or expired will not be refilled in the Emergency Department.   ° °Patients who have chronic pain may receive non-narcotic prescriptions until seen by their primary care physician.  It is every patient’s personal responsibility to maintain active prescriptions with his or her primary care physician or specialist. °

## 2012-02-01 ENCOUNTER — Emergency Department (HOSPITAL_COMMUNITY)
Admission: EM | Admit: 2012-02-01 | Discharge: 2012-02-02 | Disposition: A | Discharge status: Home / Self Care | Payer: Medicare Other | Attending: Emergency Medicine | Admitting: Emergency Medicine

## 2012-02-01 ENCOUNTER — Encounter (HOSPITAL_COMMUNITY): Payer: Self-pay | Admitting: *Deleted

## 2012-02-01 ENCOUNTER — Emergency Department (HOSPITAL_COMMUNITY): Payer: Medicare Other

## 2012-02-01 ENCOUNTER — Other Ambulatory Visit: Payer: Self-pay

## 2012-02-01 DIAGNOSIS — F1010 Alcohol abuse, uncomplicated: Secondary | ICD-10-CM | POA: Insufficient documentation

## 2012-02-01 DIAGNOSIS — E119 Type 2 diabetes mellitus without complications: Secondary | ICD-10-CM | POA: Insufficient documentation

## 2012-02-01 DIAGNOSIS — Z79899 Other long term (current) drug therapy: Secondary | ICD-10-CM | POA: Insufficient documentation

## 2012-02-01 DIAGNOSIS — G8929 Other chronic pain: Secondary | ICD-10-CM | POA: Insufficient documentation

## 2012-02-01 DIAGNOSIS — I10 Essential (primary) hypertension: Secondary | ICD-10-CM | POA: Insufficient documentation

## 2012-02-01 DIAGNOSIS — E871 Hypo-osmolality and hyponatremia: Secondary | ICD-10-CM | POA: Insufficient documentation

## 2012-02-01 DIAGNOSIS — R4182 Altered mental status, unspecified: Secondary | ICD-10-CM | POA: Insufficient documentation

## 2012-02-01 DIAGNOSIS — F10929 Alcohol use, unspecified with intoxication, unspecified: Secondary | ICD-10-CM

## 2012-02-01 DIAGNOSIS — IMO0002 Reserved for concepts with insufficient information to code with codable children: Secondary | ICD-10-CM | POA: Insufficient documentation

## 2012-02-01 LAB — COMPREHENSIVE METABOLIC PANEL
ALT: 11 U/L (ref 0–53)
Calcium: 9.3 mg/dL (ref 8.4–10.5)
Creatinine, Ser: 0.74 mg/dL (ref 0.50–1.35)
GFR calc Af Amer: >90 mL/min (ref >90)
Glucose, Bld: 130 mg/dL — ABNORMAL HIGH (ref 70–99)
Sodium: 122 mEq/L — ABNORMAL LOW (ref 135–145)
Total Protein: 7.1 g/dL (ref 6.0–8.3)

## 2012-02-01 LAB — URINALYSIS, ROUTINE W REFLEX MICROSCOPIC
Bilirubin Urine: NEGATIVE
Ketones, ur: NEGATIVE mg/dL
Leukocytes, UA: NEGATIVE
Nitrite: NEGATIVE
Protein, ur: NEGATIVE mg/dL
Urobilinogen, UA: 0.2 mg/dL (ref 0.0–1.0)

## 2012-02-01 LAB — DIFFERENTIAL
Eosinophils Absolute: 0 10*3/uL (ref 0.0–0.7)
Eosinophils Relative: 0 % (ref 0–5)
Lymphs Abs: 2.6 10*3/uL (ref 0.7–4.0)
Monocytes Absolute: 0.5 10*3/uL (ref 0.1–1.0)

## 2012-02-01 LAB — AMMONIA: Ammonia: 16 umol/L (ref 11–60)

## 2012-02-01 LAB — CBC
MCH: 33.4 pg (ref 26.0–34.0)
MCV: 94.4 fL (ref 78.0–100.0)
Platelets: 266 10*3/uL (ref 150–400)
RBC: 3.74 MIL/uL — ABNORMAL LOW (ref 4.22–5.81)
RDW: 13.3 % (ref 11.5–15.5)

## 2012-02-01 LAB — RAPID URINE DRUG SCREEN, HOSP PERFORMED
Amphetamines: NOT DETECTED
Benzodiazepines: NOT DETECTED
Cocaine: NOT DETECTED
Opiates: NOT DETECTED

## 2012-02-01 LAB — ETHANOL: Alcohol, Ethyl (B): 213 mg/dL — ABNORMAL HIGH (ref 0–11)

## 2012-02-01 MED ORDER — FOLIC ACID 5 MG/ML IJ SOLN
INTRAMUSCULAR | Status: AC
  Filled 2012-02-01: qty 0.2

## 2012-02-01 MED ORDER — THIAMINE HCL 100 MG/ML IJ SOLN
Freq: Once | INTRAVENOUS | Status: DC
  Filled 2012-02-01: qty 1000

## 2012-02-01 MED ORDER — THIAMINE HCL 100 MG/ML IJ SOLN
INTRAMUSCULAR | Status: AC
  Filled 2012-02-01: qty 2

## 2012-02-01 MED ORDER — LORAZEPAM 2 MG/ML IJ SOLN
INTRAMUSCULAR | Status: AC
  Administered 2012-02-01: 22:00:00
  Filled 2012-02-01: qty 1

## 2012-02-01 MED ORDER — ZIPRASIDONE MESYLATE 20 MG IM SOLR
20.0000 mg | Freq: Once | INTRAMUSCULAR | Status: AC
  Administered 2012-02-01: 20 mg via INTRAMUSCULAR
  Filled 2012-02-01: qty 20

## 2012-02-01 MED ORDER — SODIUM CHLORIDE 0.9 % IV SOLN
INTRAVENOUS | Status: DC
  Administered 2012-02-01: 20:00:00 via INTRAVENOUS

## 2012-02-01 MED ORDER — M.V.I. ADULT IV INJ
INJECTION | INTRAVENOUS | Status: AC
  Filled 2012-02-01: qty 20

## 2012-02-01 MED ORDER — LORAZEPAM 1 MG PO TABS
1.0000 mg | ORAL_TABLET | Freq: Once | ORAL | Status: DC
  Filled 2012-02-01: qty 1

## 2012-02-01 NOTE — ED Provider Notes (Signed)
History     CSN: [id number]  Arrival date & time 02/01/12  4098   First MD Initiated Contact with Patient 02/01/12 1822      Chief Complaint  Patient presents with  . Alcohol Intoxication  . Altered Mental Status  . Nausea  . Emesis     Patient is a 66 y.o. male presenting with intoxication, altered mental status, and vomiting. The history is provided by the EMS personnel. History Limited By: intoxicated, AMS.  Alcohol Intoxication  Altered Mental Status  Emesis   Pt was seen at 1835.  Per EMS, pt was found at home by his medication delivery service "passed out" with emesis on his shirt and mouth with large bottle of etoh near him.  Pt has hx of etoh abuse, as well as substance abuse.  Pt is lethargic, awakens to name and states "I'm fine."     Past Medical History  Diagnosis Date  . Prostate tumor   . Brain tumor   . Chronic pain   . Hypertension   . Diabetes mellitus     Past Surgical History  Procedure Date  . Brain surgery   . Eye surgery   . Tonsillectomy     History  Substance Use Topics  . Smoking status: Never Smoker   . Smokeless tobacco: Not on file  . Alcohol Use: Yes     occasional    Review of Systems  Unable to perform ROS: Other  Psychiatric/Behavioral: Positive for altered mental status.    Allergies  Review of patient's allergies indicates no known allergies.  Home Medications   Current Outpatient Rx  Name Route Sig Dispense Refill  . ACETAMINOPHEN 500 MG PO TABS Oral Take 500 mg by mouth daily as needed. For pain     . CELEBREX PO Oral Take by mouth.     . CELECOXIB 200 MG PO CAPS Oral Take 200 mg by mouth daily.      . FENTANYL 75 MCG/HR TD PT72 Transdermal Place 1 patch onto the skin every 3 (three) days.      Marland Kitchen FLUOXETINE HCL 10 MG PO CAPS Oral Take 10 mg by mouth daily.      Marland Kitchen NEURONTIN PO Oral Take by mouth.      Marland Kitchen GABAPENTIN 300 MG PO CAPS Oral Take 600 mg by mouth 3 (three) times daily.      Marland Kitchen HYDROCODONE-ACETAMINOPHEN  5-325 MG PO TABS Oral Take 1 tablet by mouth every 6 (six) hours as needed.      Marland Kitchen LORAZEPAM 1 MG PO TABS Oral Take 1 mg by mouth every 4 (four) hours as needed. For nerves     . METFORMIN HCL 500 MG PO TABS Oral Take 1,000 mg by mouth 2 (two) times daily with a meal.      . MUPIROCIN 2 % EX OINT Topical Apply 1 application topically daily as needed.      Marland Kitchen NAPROXEN 500 MG PO TABS Oral Take 1 tablet (500 mg total) by mouth 2 (two) times daily. 30 tablet 0  . PIOGLITAZONE HCL 30 MG PO TABS Oral Take 30 mg by mouth daily.      . ACTOS PO Oral Take by mouth.      [name] OP Ophthalmic Apply 1 drop to eye daily.      Marland Kitchen PREDNISOLONE ACETATE 1 % OP SUSP Left Eye Place 1 drop into the left eye 3 (three) times daily. As directed     . SILVER SULFADIAZINE  1 % EX CREA Topical Apply topically daily. 50 g 2  . TRIAMTERENE-HCTZ 37.5-25 MG PO CAPS Oral Take 1 capsule by mouth daily as needed. For swelling     . ZOLPIDEM TARTRATE 10 MG PO TABS Oral Take 10 mg by mouth at bedtime as needed. For sleep       There were no vitals taken for this visit.  Physical Exam 1840: Physical examination:  Nursing notes reviewed; Vital signs and O2 SAT reviewed;  Constitutional: Well developed, Well nourished, In no acute distress; Head:  Normocephalic, s/p multiple surgeries, no acute ecchymosis, edema or open wounds.; Eyes: EOMI, PERRL, No scleral icterus; ENMT: Mouth and pharynx normal, Mucous membranes dry; Neck: Supple, Full range of motion, No lymphadenopathy; Cardiovascular: Regular rate and rhythm, No murmur, rub, or gallop; Respiratory: Breath sounds clear & equal bilaterally, No wheezes, Normal respiratory effort/excursion; Chest: Nontender, Movement normal; Abdomen: Soft, Nontender, Nondistended, Normal bowel sounds; Extremities: Pulses normal, No tenderness, No edema, No calf edema or asymmetry.; Neuro: Lethargic, but awakens to same, speech slurred.  Moves all extremities spontaneously on stretcher.; Skin: Color  normal, Warm, Dry, no rash.    ED Course  Procedures    MDM  MDM Reviewed: previous chart, nursing note and vitals Reviewed previous: labs Interpretation: ECG, labs, x-ray and CT scan    Date: 02/01/2012  Rate: 80  Rhythm: normal sinus rhythm  QRS Axis: normal  Intervals: normal  ST/T Wave abnormalities: normal  Conduction Disutrbances:none  Narrative Interpretation:   Old EKG Reviewed: none available.  Results for orders placed during the hospital encounter of 02/01/12  AMMONIA      Component Value Range   Ammonia 16  11 - 60 (umol/L)  ACETAMINOPHEN LEVEL      Component Value Range   Acetaminophen (Tylenol), Serum <15.0  10 - 30 (ug/mL)  APTT      Component Value Range   aPTT 32  24 - 37 (seconds)  CBC      Component Value Range   WBC 7.3  4.0 - 10.5 (K/uL)   RBC 3.74 (*) 4.22 - 5.81 (MIL/uL)   Hemoglobin 12.5 (*) 13.0 - 17.0 (g/dL)   HCT 16.1 (*) 09.6 - 52.0 (%)   MCV 94.4  78.0 - 100.0 (fL)   MCH 33.4  26.0 - 34.0 (pg)   MCHC 35.4  30.0 - 36.0 (g/dL)   RDW 04.5  40.9 - 81.1 (%)   Platelets 266  150 - 400 (K/uL)  DIFFERENTIAL      Component Value Range   Neutrophils Relative 58  43 - 77 (%)   Neutro Abs 4.2  1.7 - 7.7 (K/uL)   Lymphocytes Relative 35  12 - 46 (%)   Lymphs Abs 2.6  0.7 - 4.0 (K/uL)   Monocytes Relative 7  3 - 12 (%)   Monocytes Absolute 0.5  0.1 - 1.0 (K/uL)   Eosinophils Relative 0  0 - 5 (%)   Eosinophils Absolute 0.0  0.0 - 0.7 (K/uL)   Basophils Relative 0  0 - 1 (%)   Basophils Absolute 0.0  0.0 - 0.1 (K/uL)  COMPREHENSIVE METABOLIC PANEL      Component Value Range   Sodium 122 (*) 135 - 145 (mEq/L)   Potassium 4.1  3.5 - 5.1 (mEq/L)   Chloride 86 (*) 96 - 112 (mEq/L)   CO2 25  19 - 32 (mEq/L)   Glucose, Bld 130 (*) 70 - 99 (mg/dL)   BUN 18  6 -  23 (mg/dL)   Creatinine, Ser 1.61  0.50 - 1.35 (mg/dL)   Calcium 9.3  8.4 - 09.6 (mg/dL)   Total Protein 7.1  6.0 - 8.3 (g/dL)   Albumin 3.8  3.5 - 5.2 (g/dL)   AST 13  0 - 37 (U/L)     ALT 11  0 - 53 (U/L)   Alkaline Phosphatase 76  39 - 117 (U/L)   Total Bilirubin 0.2 (*) 0.3 - 1.2 (mg/dL)   GFR calc non Af Amer >90  >90 (mL/min)   GFR calc Af Amer >90  >90 (mL/min)  ETHANOL      Component Value Range   Alcohol, Ethyl (B) 213 (*) 0 - 11 (mg/dL)  LIPASE, BLOOD      Component Value Range   Lipase 18  11 - 59 (U/L)  URINALYSIS, ROUTINE W REFLEX MICROSCOPIC      Component Value Range   Color, Urine YELLOW  YELLOW    APPearance CLEAR  CLEAR    Specific Gravity, Urine 1.020  1.005 - 1.030    pH 5.5  5.0 - 8.0    Glucose, UA NEGATIVE  NEGATIVE (mg/dL)   Hgb urine dipstick NEGATIVE  NEGATIVE    Bilirubin Urine NEGATIVE  NEGATIVE    Ketones, ur NEGATIVE  NEGATIVE (mg/dL)   Protein, ur NEGATIVE  NEGATIVE (mg/dL)   Urobilinogen, UA 0.2  0.0 - 1.0 (mg/dL)   Nitrite NEGATIVE  NEGATIVE    Leukocytes, UA NEGATIVE  NEGATIVE   SALICYLATE LEVEL      Component Value Range   Salicylate Lvl <2.0 (*) 2.8 - 20.0 (mg/dL)  PROTIME-INR      Component Value Range   Prothrombin Time 12.5  11.6 - 15.2 (seconds)   INR 0.91  0.00 - 1.49   TROPONIN I      Component Value Range   Troponin I <0.30  <0.30 (ng/mL)    Dg Chest 1 View 02/01/2012  *RADIOLOGY REPORT*  Clinical Data: Hypertension.  Diabetes.  Altered mental status.  CHEST - 1 VIEW  Comparison: 12/11/2009  Findings: Atherosclerotic calcification of the aortic arch noted.  Heart size is within normal limits.  Low lung volumes are present, causing crowding of the pulmonary vasculature.  Mild dextroconvex thoracic scoliosis noted.  IMPRESSION:  1. Low lung volumes are present, causing crowding of the pulmonary vasculature. 2.  Mild atherosclerosis of the aortic arch.  Original Report Authenticated By: Dellia Cloud, M.D.    Ct Head Wo Contrast 02/01/2012  *RADIOLOGY REPORT*  Clinical Data: Altered mental status.  CT HEAD WITHOUT CONTRAST  Technique:  Contiguous axial images were obtained from the base of the skull through the  vertex without contrast.  Comparison: 06/04/2011  Findings: Left temporal lobe encephalomalacia and craniectomy noted, stable appearance compared to the prior exam.  Posterior fossa structures appear unremarkable.  The cerebral peduncles and thalami appear unremarkable.  Small dilated perivascular space noted along the right lentiform nucleus.  Basal ganglia otherwise unremarkable.  Deformity of the right periorbital region noted with findings related to prior operative intervention along the middle cranial fossa and periorbital region, similar to prior exams.  No intracranial hemorrhage, mass lesion, or acute infarction is identified.  IMPRESSION:  1.  Stable appearance, with left temporal encephalomalacia and postoperative findings along the left temporal bone, and extensive postoperative findings along the right facial region.  No acute intracranial abnormality is observed.  Original Report Authenticated By: Dellia Cloud, M.D.      [phone]  PM:  Pt has now awakened fully, is cursing and yelling at ED staff, pulled his IV out and is trying to get himself dressed back in his clothes, stating he is "going to leave right now!"  Pt does not have anyone who will come pick him up (friends or family), and he does not appear to be sober enough to make this decision at this time.  ACT Maximiano Coss knows pt well and is here in the ED. She has come to bedside, with pt continuing to yell now at her.  She agrees pt does not appear to be able to make his own medical decisions at this time and we are concerned regarding his safety if he attempts to leave the ED at this time.  IVC paperwork completed for his safety.  Pt will be given IM ativan to help calm him, have IVF restarted.  Pt's sodium likely low due to etoh intake, but he will need a re-check of his sodium and etoh levels in the morning after further gentle IVF hydration.  Sign out to Dr. Manus Gunning.             Laray Anger, DO 02/03/12 1306

## 2012-02-01 NOTE — ED Notes (Signed)
Pt resting with eyes closed.

## 2012-02-01 NOTE — BH Assessment (Signed)
Assessment Note   Levi Morris is an 66 y.o. male. He came into the ED after being found in his home intoxicated, passed out and had vomited on himself. He was brought to the ED to ensure he had not suffered any injuries from a fall and to ensure his well being. He has begun to sober up and has become irritable, agitated and uncooperative. He is demanding to go home. His BAC currently is 213, which is too high for him to leave the ED. He has not made any statements to harm himself or others, but because he is unsteady and not coherent enough to leave safely, patient will need to remain in the ED until he is back to a non-alcohol baseline. ED physician requested IVC paperwork to hold patient in the ED until he is calm and back to baseline.   Axis I: Substance Abuse Axis II Deferred Axis III See below Axis IV: Financial issues, family issues, poor social support Axis V: 62  Past Medical History:  Past Medical History  Diagnosis Date  . Prostate tumor   . Brain tumor   . Chronic pain   . Hypertension   . Diabetes mellitus     Past Surgical History  Procedure Date  . Brain surgery   . Eye surgery   . Tonsillectomy     Family History: History reviewed. No pertinent family history.  Social History:  reports that he has never smoked. He does not have any smokeless tobacco history on file. He reports that he drinks alcohol. He reports that he does not use illicit drugs.  Additional Social History:    Allergies: No Known Allergies  Home Medications:  Medications Prior to Admission  Medication Dose Route Frequency Provider Last Rate Last Dose  . 0.9 %  sodium chloride infusion   Intravenous Continuous Laray Anger, DO 100 mL/hr at 02/01/12 1953    . LORazepam (ATIVAN) 2 MG/ML injection           . sodium chloride 0.9 % 1,000 mL with thiamine 100 mg, folic acid 1 mg, multivitamins adult 10 mL infusion   Intravenous Once Laray Anger, DO      . DISCONTD: LORazepam (ATIVAN)  tablet 1 mg  1 mg Oral Once Laray Anger, DO       Medications Prior to Admission  Medication Sig Dispense Refill  . acetaminophen (TYLENOL) 500 MG tablet Take 500 mg by mouth daily as needed. For pain       . Celecoxib (CELEBREX PO) Take by mouth.       . celecoxib (CELEBREX) 200 MG capsule Take 200 mg by mouth daily.        . fentaNYL (DURAGESIC - DOSED MCG/HR) 75 MCG/HR Place 1 patch onto the skin every 3 (three) days.        Marland Kitchen FLUoxetine (PROZAC) 10 MG capsule Take 10 mg by mouth daily.        . Gabapentin (NEURONTIN PO) Take by mouth.        . gabapentin (NEURONTIN) 300 MG capsule Take 600 mg by mouth 3 (three) times daily.        Marland Kitchen HYDROcodone-acetaminophen (NORCO) 5-325 MG per tablet Take 1 tablet by mouth every 6 (six) hours as needed.        Marland Kitchen LORazepam (ATIVAN) 1 MG tablet Take 1 mg by mouth every 4 (four) hours as needed. For nerves       . metFORMIN (GLUCOPHAGE) 500 MG tablet  Take 1,000 mg by mouth 2 (two) times daily with a meal.        . mupirocin (BACTROBAN) 2 % ointment Apply 1 application topically daily as needed.        . naproxen (NAPROSYN) 500 MG tablet Take 1 tablet (500 mg total) by mouth 2 (two) times daily.  30 tablet  0  . pioglitazone (ACTOS) 30 MG tablet Take 30 mg by mouth daily.        . Pioglitazone HCl (ACTOS PO) Take by mouth.        Bertram Gala Glycol-Propyl Glycol (SYSTANE OP) Apply 1 drop to eye daily.        . prednisoLONE acetate (PRED FORTE) 1 % ophthalmic suspension Place 1 drop into the left eye 3 (three) times daily. As directed       . silver sulfADIAZINE (SILVADENE) 1 % cream Apply topically daily.  50 g  2  . triamterene-hydrochlorothiazide (DYAZIDE) 37.5-25 MG per capsule Take 1 capsule by mouth daily as needed. For swelling       . zolpidem (AMBIEN) 10 MG tablet Take 10 mg by mouth at bedtime as needed. For sleep         OB/GYN Status:  No LMP for male patient.  General Assessment Data Location of Assessment: [facility] ACT Assessment:  Yes Living Arrangements: Alone Can pt return to current living arrangement?: Yes Admission Status: Involuntary Is patient capable of signing voluntary admission?: No Transfer from: Acute Hospital Referral Source: MD     Risk to self Suicidal Ideation: No Suicidal Intent: No Is patient at risk for suicide?: No Suicidal Plan?: No Access to Means: No What has been your use of drugs/alcohol within the last 12 months?: chronic Previous Attempts/Gestures: No Recent stressful life event(s): Divorce;Financial Problems Substance abuse history and/or treatment for substance abuse?: Yes Suicide prevention information given to non-admitted patients: Not applicable  Risk to Others Homicidal Ideation: No Thoughts of Harm to Others: No Current Homicidal Intent: No Current Homicidal Plan: No Access to Homicidal Means: No History of harm to others?: No Assessment of Violence: None Noted (agitated) Violent Behavior Description: disruptive Does patient have access to weapons?: No Criminal Charges Pending?: No Does patient have a court date: No  Psychosis Hallucinations: None noted Delusions: None noted  Mental Status Report Appear/Hygiene: Body odor;Disheveled;Poor hygiene Eye Contact: Poor Motor Activity: Agitation;Mannerisms;Restlessness Speech: Pressured;Loud;Slurred Level of Consciousness: Alert Mood: Irritable Affect: Angry;Blunted;Irritable Anxiety Level: Minimal Thought Processes: Irrelevant Judgement: Impaired Orientation: Place;Person Obsessive Compulsive Thoughts/Behaviors: Minimal  Cognitive Functioning Concentration: Decreased Memory: Recent Intact IQ: Average Insight: Poor Impulse Control: Poor Appetite: Fair Vegetative Symptoms: None  Prior Inpatient Therapy Prior Inpatient Therapy: No  Prior Outpatient Therapy Prior Outpatient Therapy: No                     Additional Information 1:1 In Past 12 Months?: No CIRT Risk: Yes Elopement Risk:  Yes Does patient have medical clearance?: No     Disposition:  Disposition Disposition of Patient: Other dispositions (patient to keep kept until he sobers up in ED) Other disposition(s): Other (Comment) (F/U at [facility])  On Site Evaluation by:  Dr. Clarene Duke Reviewed with Physician:  Dr. Clarene Duke  Patient was given ativan IM after he refused to take ativan PO. He is still agitated.  Shon Baton H 02/01/2012 10:33 PM

## 2012-02-01 NOTE — ED Notes (Signed)
Pt very upset and combative. EDP, security, and sitter at bedside.

## 2012-02-01 NOTE — ED Notes (Signed)
Patient attempting to get out of bed and yelling at staff

## 2012-02-01 NOTE — ED Notes (Signed)
Dr.mcmanus at side talking to patient with act team at side

## 2012-02-01 NOTE — ED Notes (Signed)
Pt arrived via ems d/t alcohol intoxication. EMS reports pt was found by his medication delivery service. Pt has emesis to shirt and mouth.

## 2012-02-02 LAB — BASIC METABOLIC PANEL
BUN: 14 mg/dL (ref 6–23)
CO2: 26 mEq/L (ref 19–32)
Chloride: 85 mEq/L — ABNORMAL LOW (ref 96–112)
Creatinine, Ser: 0.65 mg/dL (ref 0.50–1.35)
GFR calc Af Amer: >90 mL/min (ref >90)
Potassium: 3.6 mEq/L (ref 3.5–5.1)

## 2012-02-02 LAB — ETHANOL: Alcohol, Ethyl (B): 127 mg/dL — ABNORMAL HIGH (ref 0–11)

## 2012-02-02 NOTE — ED Notes (Signed)
[facility] [phone] and left message

## 2012-02-02 NOTE — ED Notes (Addendum)
Advised pt that he would have to wait for a taxi in the morning. Pt upset and said "no!' I advised him I would talk to EDP.

## 2012-02-02 NOTE — Discharge Instructions (Signed)
How Much is Too Much Alcohol?       Drinking too much alcohol can cause injury, accidents, and health problems. These types of problems can include:   Car crashes.   Falls.   Family fighting (domestic violence).   Drowning.   Fights.   Injuries.   Burns.   Damage to certain organs.   Having a baby with birth defects.  ONE DRINK CAN BE TOO MUCH WHEN YOU ARE:   Working.   Pregnant or breastfeeding.   Taking medicines. Ask your doctor.   Driving or planning to drive.  WHAT IS A STANDARD DRINK?   1 regular beer (12 ounces or 360 milliliters).   1 glass of wine (5 ounces or 150 milliliters).   1 shot of liquor (1.5 ounces or 45 milliliters).  BLOOD ALCOHOL LEVELS   .00 A person is sober.   .03 A person has no trouble keeping balance, talking, or seeing right, but a "buzz" may be felt.   .05 A person feels "buzzed" and relaxed.   .08 or .10 A person is drunk. He or she has trouble talking, seeing right, and keeping his or her balance.   .15 A person loses body control and may pass out (blackout).   .20 A person has trouble walking (staggering) and throws up (vomits).   .30 A person will pass out (unconscious).   .40+ A person will be in a coma. Death is possible.  If you or someone you know has a drinking problem, get help from a doctor.   Document Released: 08/10/2009 Document Revised: 10/03/2011 Document Reviewed: 08/10/2009   [employer] Patient Information 2012 [employer].

## 2012-02-02 NOTE — ED Notes (Signed)
Pt states he has no ride home.

## 2012-02-02 NOTE — ED Notes (Signed)
Pt lying in bed  

## 2012-02-03 LAB — URINE CULTURE
Colony Count: NO GROWTH
Culture  Setup Time: 201304072009
Culture: NO GROWTH

## 2012-02-03 NOTE — ED Provider Notes (Signed)
Care assumed from Dr. Clarene Duke.  I agree with her note, assessment, and plan.  The patient is no longer intoxicated and will be discharged to home.  He appears at his baseline.    Geoffery Lyons, MD 02/03/12 [phone]

## 2012-03-24 ENCOUNTER — Other Ambulatory Visit: Payer: Self-pay | Admitting: Neurology

## 2012-03-24 DIAGNOSIS — R51 Headache: Secondary | ICD-10-CM

## 2012-03-25 ENCOUNTER — Ambulatory Visit (HOSPITAL_COMMUNITY)
Admission: RE | Admit: 2012-03-25 | Discharge: 2012-03-25 | Disposition: A | Discharge status: Home / Self Care | Payer: Medicare Other | Source: Ambulatory Visit | Attending: Neurology | Admitting: Neurology

## 2012-03-25 DIAGNOSIS — Z9889 Other specified postprocedural states: Secondary | ICD-10-CM | POA: Insufficient documentation

## 2012-03-25 DIAGNOSIS — G319 Degenerative disease of nervous system, unspecified: Secondary | ICD-10-CM | POA: Insufficient documentation

## 2012-03-25 DIAGNOSIS — R51 Headache: Secondary | ICD-10-CM | POA: Insufficient documentation

## 2012-03-25 LAB — POCT I-STAT, CHEM 8
Chloride: 107 mEq/L (ref 96–112)
Creatinine, Ser: 1 mg/dL (ref 0.50–1.35)
Glucose, Bld: 155 mg/dL — ABNORMAL HIGH (ref 70–99)
HCT: 41 % (ref 39.0–52.0)
Hemoglobin: 13.9 g/dL (ref 13.0–17.0)
Potassium: 3.9 mEq/L (ref 3.5–5.1)
Sodium: 139 mEq/L (ref 135–145)

## 2012-03-25 MED ORDER — GADOBENATE DIMEGLUMINE 529 MG/ML IV SOLN
20.0000 mL | Freq: Once | INTRAVENOUS | Status: AC | PRN
  Administered 2012-03-25: 20 mL via INTRAVENOUS

## 2012-03-25 NOTE — Progress Notes (Signed)
Venipuncture performed in right antecubital for Creatnine level  

## 2012-05-18 DIAGNOSIS — I10 Essential (primary) hypertension: Secondary | ICD-10-CM | POA: Insufficient documentation

## 2012-05-18 DIAGNOSIS — Z9889 Other specified postprocedural states: Secondary | ICD-10-CM | POA: Insufficient documentation

## 2012-05-18 DIAGNOSIS — R51 Headache: Secondary | ICD-10-CM | POA: Insufficient documentation

## 2012-05-18 DIAGNOSIS — Z79899 Other long term (current) drug therapy: Secondary | ICD-10-CM | POA: Insufficient documentation

## 2012-05-18 DIAGNOSIS — E119 Type 2 diabetes mellitus without complications: Secondary | ICD-10-CM | POA: Insufficient documentation

## 2012-05-18 DIAGNOSIS — R110 Nausea: Secondary | ICD-10-CM | POA: Insufficient documentation

## 2012-05-19 ENCOUNTER — Emergency Department (HOSPITAL_COMMUNITY)
Admission: EM | Admit: 2012-05-19 | Discharge: 2012-05-19 | Disposition: A | Discharge status: Home / Self Care | Payer: Medicare Other | Attending: Emergency Medicine | Admitting: Emergency Medicine

## 2012-05-19 ENCOUNTER — Encounter (HOSPITAL_COMMUNITY): Payer: Self-pay | Admitting: *Deleted

## 2012-05-19 DIAGNOSIS — R51 Headache: Secondary | ICD-10-CM

## 2012-05-19 HISTORY — DX: Dorsalgia, unspecified: M54.9

## 2012-05-19 MED ORDER — PROMETHAZINE HCL 25 MG/ML IJ SOLN
25.0000 mg | Freq: Once | INTRAMUSCULAR | Status: AC
  Administered 2012-05-19: 25 mg via INTRAMUSCULAR
  Filled 2012-05-19: qty 1

## 2012-05-19 MED ORDER — DROPERIDOL 2.5 MG/ML IJ SOLN
2.5000 mg | Freq: Once | INTRAMUSCULAR | Status: AC
  Administered 2012-05-19: 2.5 mg via INTRAVENOUS
  Filled 2012-05-19: qty 2

## 2012-05-19 MED ORDER — SODIUM CHLORIDE 0.9 % IV BOLUS (SEPSIS)
1000.0000 mL | Freq: Once | INTRAVENOUS | Status: AC
  Administered 2012-05-19: 1000 mL via INTRAVENOUS

## 2012-05-19 MED ORDER — METOCLOPRAMIDE HCL 5 MG/ML IJ SOLN: 10.0000 mg | Freq: Once | INTRAMUSCULAR | Status: DC

## 2012-05-19 MED ORDER — HYDROMORPHONE HCL PF 2 MG/ML IJ SOLN
2.0000 mg | Freq: Once | INTRAMUSCULAR | Status: AC
  Administered 2012-05-19: 2 mg via INTRAMUSCULAR
  Filled 2012-05-19: qty 1

## 2012-05-19 MED ORDER — DIPHENHYDRAMINE HCL 50 MG/ML IJ SOLN
25.0000 mg | Freq: Once | INTRAMUSCULAR | Status: AC
  Administered 2012-05-19: 25 mg via INTRAVENOUS
  Filled 2012-05-19: qty 1

## 2012-05-19 MED ORDER — DEXAMETHASONE SODIUM PHOSPHATE 4 MG/ML IJ SOLN
10.0000 mg | Freq: Once | INTRAMUSCULAR | Status: AC
  Administered 2012-05-19: 10 mg via INTRAVENOUS
  Filled 2012-05-19: qty 3

## 2012-05-19 NOTE — ED Notes (Signed)
Pt states he is not feeling any better at this time. States he may feel worse.

## 2012-05-19 NOTE — ED Notes (Addendum)
Pt alert & oriented x4. Patient given discharge instructions, paperwork & prescription(s). Patient instructed to stop at the registration desk to finish any additional paperwork. Patient verbalized understanding. Pt left department w/ no further questions. 

## 2012-05-19 NOTE — ED Notes (Signed)
Pt states his legs are hurting when he standing up & little toe on right foot is hurting.

## 2012-05-19 NOTE — ED Notes (Signed)
Call Flossie Dibble at [phone] to give pt ride back home if discharged.

## 2012-05-19 NOTE — ED Notes (Signed)
Levi Morris called to pick pt up. States he will be up as soon as he can get dressed.

## 2012-05-19 NOTE — ED Notes (Signed)
Spoke w/ EDP about additional pain medication & the possibility of having social services see pt.

## 2012-05-19 NOTE — ED Notes (Signed)
Pt reports head hurting real bad, pt wanting to see Dr Ouida Sills, & wants to be sedated. Asking for his sleeping pills.

## 2012-05-19 NOTE — ED Notes (Addendum)
Pt states nausea & headache for the past two weeks. Was seen by dr Gerilyn Pilgrim today & given a shot. Pt states he needs to be sedated.

## 2012-05-19 NOTE — ED Provider Notes (Signed)
History     CSN: [id number]  Arrival date & time 05/18/12  2355   First MD Initiated Contact with Patient 05/19/12 0207      Chief Complaint  Patient presents with  . Nausea  . Headache    (Consider location/radiation/quality/duration/timing/severity/associated sxs/prior treatment) HPI History provided by patient. History of chronic headaches. Has had constant headache for last 2 weeks unrelieved by anything. Takes chronic pain medications at home and anxiolytics without relief. He saw his neurologist today and received an injection for this headache without relief. Tonight having persistent symptoms and called a friend a ride to the emergency department. He is requesting to be sedated and admitted to the hospital for chronic pain. He lives alone. He denies any fall or trauma. He denies any neck pain or neck stiffness. No fevers or chills. No vomiting. No change in vision, which at baseline is very poor.history of brain surgery, brain tumor and recent CAT scans and MRIs. Pain is both sharp and dull, located all over and unchanged in character and quality from his chronic headaches. No thunderclap headache. Not worse headache of his life. Past Medical History  Diagnosis Date  . Prostate tumor   . Brain tumor   . Chronic pain   . Hypertension   . Diabetes mellitus     Past Surgical History  Procedure Date  . Brain surgery   . Eye surgery   . Tonsillectomy     No family history on file.  History  Substance Use Topics  . Smoking status: Never Smoker   . Smokeless tobacco: Not on file  . Alcohol Use: Yes     occasional      Review of Systems  Constitutional: Negative for fever and chills.  HENT: Negative for neck pain and neck stiffness.   Eyes: Negative for pain.  Respiratory: Negative for shortness of breath.   Cardiovascular: Negative for chest pain.  Gastrointestinal: Negative for abdominal pain.  Genitourinary: Negative for dysuria.  Musculoskeletal: Negative for  back pain.  Skin: Negative for rash.  Neurological: Positive for headaches.  All other systems reviewed and are negative.    Allergies  Review of patient's allergies indicates no known allergies.  Home Medications   Current Outpatient Rx  Name Route Sig Dispense Refill  . ACETAMINOPHEN 500 MG PO TABS Oral Take 500 mg by mouth daily as needed. For pain     . CELECOXIB 200 MG PO CAPS Oral Take 200 mg by mouth daily.      . DULOXETINE HCL 60 MG PO CPEP Oral Take 60 mg by mouth daily.    Marland Kitchen GABAPENTIN 300 MG PO CAPS Oral Take 600 mg by mouth 3 (three) times daily.      Marland Kitchen HYDROCODONE-ACETAMINOPHEN 7.5-325 MG PO TABS Oral Take 1 tablet by mouth every 4 (four) hours as needed.    . IBUPROFEN 200 MG PO TABS Oral Take 200 mg by mouth every 6 (six) hours as needed.    Marland Kitchen LORAZEPAM 1 MG PO TABS Oral Take 1 mg by mouth every 4 (four) hours as needed. For nerves     . METFORMIN HCL 500 MG PO TABS Oral Take 1,000 mg by mouth 2 (two) times daily with a meal.      . OXCARBAZEPINE 300 MG PO TABS Oral Take 300 mg by mouth every 6 (six) hours as needed.    Marland Kitchen PIOGLITAZONE HCL 30 MG PO TABS Oral Take 30 mg by mouth daily.      Marland Kitchen  SYSTANE OP Ophthalmic Apply 1 drop to eye daily.      Marland Kitchen PREDNISOLONE ACETATE 1 % OP SUSP Left Eye Place 1 drop into the left eye 3 (three) times daily. As directed     . TRIAMTERENE-HCTZ 37.5-25 MG PO CAPS Oral Take 1 capsule by mouth daily as needed. For swelling     . CELEBREX PO Oral Take by mouth.     . FENTANYL 75 MCG/HR TD PT72 Transdermal Place 1 patch onto the skin every 3 (three) days.      Marland Kitchen FLUOXETINE HCL 10 MG PO CAPS Oral Take 10 mg by mouth daily.      Marland Kitchen NEURONTIN PO Oral Take by mouth.      Marland Kitchen HYDROCODONE-ACETAMINOPHEN 5-325 MG PO TABS Oral Take 1 tablet by mouth every 6 (six) hours as needed.     Marland Kitchen MUPIROCIN 2 % EX OINT Topical Apply 1 application topically daily as needed.      Marland Kitchen NAPROXEN 500 MG PO TABS Oral Take 1 tablet (500 mg total) by mouth 2 (two) times daily.  30 tablet 0  . ACTOS PO Oral Take by mouth.      Marland Kitchen SILVER SULFADIAZINE 1 % EX CREA Topical Apply topically daily. 50 g 2  . ZOLPIDEM TARTRATE 10 MG PO TABS Oral Take 10 mg by mouth at bedtime as needed. For sleep       BP 149/86  Pulse 112  Temp 98.3 F (36.8 C) (Oral)  Ht 6\' 3"  (1.905 m)  Wt 250 lb (113.399 kg)  BMI 31.25 kg/m2  SpO2 96%  Physical Exam  Constitutional: He is oriented to person, place, and time. He appears well-developed and well-nourished.  HENT:  Head: Atraumatic.  Right Ear: External ear normal.  Left Ear: External ear normal.  Eyes:       Left eye pupil reactive with extraocular movements intact.   Neck: Trachea normal. Neck supple. No thyromegaly present.  Cardiovascular: Normal rate, regular rhythm, S1 normal, S2 normal and normal pulses.     No systolic murmur is present   No diastolic murmur is present  Pulses:      Radial pulses are 2+ on the right side, and 2+ on the left side.  Pulmonary/Chest: Effort normal and breath sounds normal. He has no wheezes. He has no rhonchi. He has no rales. He exhibits no tenderness.  Abdominal: Soft. Normal appearance and bowel sounds are normal. There is no tenderness. There is no CVA tenderness and negative Murphy's sign.  Musculoskeletal:       Moves all extremities x4 without deficits. We'll strengths, sensorium to light touch and DTRs.  Neurological: He is alert and oriented to person, place, and time. He has normal strength. No cranial nerve deficit or sensory deficit. GCS eye subscore is 4. GCS verbal subscore is 5. GCS motor subscore is 6.  Skin: Skin is warm and dry. No rash noted. He is not diaphoretic.  Psychiatric: His speech is normal.       Cooperative and appropriate    ED Course  Procedures (including critical care time)  IV fluids and had a cocktail provided. On recheck still having similar pain. Old records reviewed - previous ECG QTc 422. Droperidol provided IV. On recheck still having similar  pain. Patient inadvertently tore out his IV while rolling in bed. I offered him the option of another IV and further medications or intramuscular injection. Patient requested IM injection which was provided. Recheck at 6 AM, requesting to  be discharged home. I offered him the option of speaking to a Child psychotherapist later on this morning and patient states he spoke with a Child psychotherapist a week ago regarding his living condition and chronic pains and does not feel like social work has anything to offer him today. No change on neuro exam. Patient states understanding all discharge and followup instructions. No new deficits to suggest need for emergent CAT scan for imaging at this time.patient has primary care and neurology followup and all medications at home.  MDM   Nursing notes reviewed. Vital signs reviewed. Old records reviewed. IV fluids and IV medications as above.        Sunnie Nielsen, MD 05/19/12 [phone]

## 2012-05-19 NOTE — Discharge Instructions (Signed)
Headache The specific cause of your headache may not have been found today. There are many causes and types of headache. A few common ones are:  Tension headache.   Migraine.   Infections (examples: dental and sinus infections).   Bone and/or joint problems in the neck or jaw.   Depression.   Eye problems.  These headaches are not life threatening.  Headaches can sometimes be diagnosed by a patient history and a physical exam. Sometimes, lab and imaging studies (such as x-ray and/or CT scan) are used to rule out more serious problems. In some cases, a spinal tap (lumbar puncture) may be requested. There are many times when your exam and tests may be normal on the first visit even when there is a serious problem causing your headaches. Because of that, it is very important to follow up with your doctor or local clinic for further evaluation. FINDING OUT THE RESULTS OF TESTS  If a radiology test was performed, a radiologist will review your results.   You will be contacted by the emergency department or your physician if any test results require a change in your treatment plan.   Not all test results may be available during your visit. If your test results are not back during the visit, make an appointment with your caregiver to find out the results. Do not assume everything is normal if you have not heard from your caregiver or the medical facility. It is important for you to follow up on all of your test results.  HOME CARE INSTRUCTIONS   Keep follow-up appointments with your caregiver, or any specialist referral.   Only take over-the-counter or prescription medicines for pain, discomfort, or fever as directed by your caregiver.   Biofeedback, massage, or other relaxation techniques may be helpful.   Ice packs or heat applied to the head and neck can be used. Do this three to four times per day, or as needed.   Call your doctor if you have any questions or concerns.   If you smoke,  you should quit.  SEEK MEDICAL CARE IF:   You develop problems with medications prescribed.   You do not respond to or obtain relief from medications.   You have a change from the usual headache.   You develop nausea or vomiting.  SEEK IMMEDIATE MEDICAL CARE IF:   If your headache becomes severe.   You have an unexplained oral temperature above 102 F (38.9 C), or as your caregiver suggests.   You have a stiff neck.   You have loss of vision.   You have muscular weakness.   You have loss of muscular control.   You develop severe symptoms different from your first symptoms.   You start losing your balance or have trouble walking.   You feel faint or pass out.  MAKE SURE YOU:   Understand these instructions.   Will watch your condition.

## 2012-07-16 ENCOUNTER — Encounter (HOSPITAL_COMMUNITY): Payer: Self-pay | Admitting: *Deleted

## 2012-07-16 ENCOUNTER — Emergency Department (HOSPITAL_COMMUNITY)
Admission: EM | Admit: 2012-07-16 | Discharge: 2012-07-16 | Disposition: A | Discharge status: Home / Self Care | Payer: Medicare Other | Attending: Emergency Medicine | Admitting: Emergency Medicine

## 2012-07-16 DIAGNOSIS — E119 Type 2 diabetes mellitus without complications: Secondary | ICD-10-CM | POA: Insufficient documentation

## 2012-07-16 DIAGNOSIS — I10 Essential (primary) hypertension: Secondary | ICD-10-CM | POA: Insufficient documentation

## 2012-07-16 DIAGNOSIS — Z79899 Other long term (current) drug therapy: Secondary | ICD-10-CM | POA: Insufficient documentation

## 2012-07-16 DIAGNOSIS — G8929 Other chronic pain: Secondary | ICD-10-CM | POA: Insufficient documentation

## 2012-07-16 DIAGNOSIS — R51 Headache: Secondary | ICD-10-CM | POA: Insufficient documentation

## 2012-07-16 NOTE — ED Provider Notes (Signed)
History  This chart was scribed for Derwood Kaplan, MD by Ladona Ridgel Day. This patient was seen in room APA18/APA18 and the patient's care was started at 2059.   CSN: [id number]  Arrival date & time 07/16/12  2059   First MD Initiated Contact with Patient 07/16/12 2121      Chief Complaint  Patient presents with  . Headache   The history is provided by the patient. No language interpreter was used.   Levi Morris is a 66 y.o. male who presents to the Emergency Department by EMS complaining of chronic HA. He states he had a HA when he called EMS from his home but it is gone now and he would like to go home. He denies any trauma or falls that would cause his HA. He took some medicine at home and it didn't help. He states no trouble walking. No blurry or double vision. No recent falls or trauma to his head. No numbness tingling, seizures, weakness. Lives alone at home. He denies any SI/HI.  Past Medical History  Diagnosis Date  . Prostate tumor   . Brain tumor   . Chronic pain   . Hypertension   . Diabetes mellitus   . Back ache     Past Surgical History  Procedure Date  . Brain surgery   . Eye surgery   . Tonsillectomy     No family history on file.  History  Substance Use Topics  . Smoking status: Never Smoker   . Smokeless tobacco: Not on file  . Alcohol Use: Yes     occasional      Review of Systems  Constitutional: Negative for fever, chills and fatigue.  Respiratory: Negative for shortness of breath.   Cardiovascular: Negative for chest pain.  Gastrointestinal: Negative for nausea, vomiting and abdominal pain.  Musculoskeletal: Negative for back pain.  Neurological: Negative for syncope, weakness and headaches.  All other systems reviewed and are negative.    Allergies  Review of patient's allergies indicates no known allergies.  Home Medications   Current Outpatient Rx  Name Route Sig Dispense Refill  . ACETAMINOPHEN 500 MG PO TABS Oral Take 500 mg by  mouth daily as needed. For pain     . CELEBREX PO Oral Take by mouth.     . CELECOXIB 200 MG PO CAPS Oral Take 200 mg by mouth daily.      . DULOXETINE HCL 60 MG PO CPEP Oral Take 60 mg by mouth daily.    . FENTANYL 75 MCG/HR TD PT72 Transdermal Place 1 patch onto the skin every 3 (three) days.      Marland Kitchen FLUOXETINE HCL 10 MG PO CAPS Oral Take 10 mg by mouth daily.      Marland Kitchen NEURONTIN PO Oral Take by mouth.      Marland Kitchen GABAPENTIN 300 MG PO CAPS Oral Take 600 mg by mouth 3 (three) times daily.      Marland Kitchen HYDROCODONE-ACETAMINOPHEN 5-325 MG PO TABS Oral Take 1 tablet by mouth every 6 (six) hours as needed.     Marland Kitchen HYDROCODONE-ACETAMINOPHEN 7.5-325 MG PO TABS Oral Take 1 tablet by mouth every 4 (four) hours as needed.    . IBUPROFEN 200 MG PO TABS Oral Take 200 mg by mouth every 6 (six) hours as needed.    Marland Kitchen LORAZEPAM 1 MG PO TABS Oral Take 1 mg by mouth every 4 (four) hours as needed. For nerves     . METFORMIN HCL 500 MG PO TABS  Oral Take 1,000 mg by mouth 2 (two) times daily with a meal.      . MUPIROCIN 2 % EX OINT Topical Apply 1 application topically daily as needed.      Marland Kitchen NAPROXEN 500 MG PO TABS Oral Take 1 tablet (500 mg total) by mouth 2 (two) times daily. 30 tablet 0  . OXCARBAZEPINE 300 MG PO TABS Oral Take 300 mg by mouth every 6 (six) hours as needed.    Marland Kitchen PIOGLITAZONE HCL 30 MG PO TABS Oral Take 30 mg by mouth daily.      . ACTOS PO Oral Take by mouth.      [name] OP Ophthalmic Apply 1 drop to eye daily.      Marland Kitchen PREDNISOLONE ACETATE 1 % OP SUSP Left Eye Place 1 drop into the left eye 3 (three) times daily. As directed     . SILVER SULFADIAZINE 1 % EX CREA Topical Apply topically daily. 50 g 2  . TRIAMTERENE-HCTZ 37.5-25 MG PO CAPS Oral Take 1 capsule by mouth daily as needed. For swelling     . ZOLPIDEM TARTRATE 10 MG PO TABS Oral Take 10 mg by mouth at bedtime as needed. For sleep       Triage Vitals: BP 124/73  Pulse 76  Temp 98.4 F (36.9 C) (Oral)  Resp 20  SpO2 92%  Physical Exam    Nursing note and vitals reviewed. Constitutional: He is oriented to person, place, and time. He appears well-developed and well-nourished. No distress.  HENT:  Head: Normocephalic and atraumatic.  Eyes: EOM are normal. Pupils are equal, round, and reactive to light.       Pupils 3 mm dilated and reactive to light  Neck: Neck supple. No tracheal deviation present.  Cardiovascular: Normal rate, regular rhythm and normal heart sounds.   No murmur heard. Pulmonary/Chest: Effort normal. No respiratory distress.  Abdominal: Soft. He exhibits no distension. There is no tenderness. There is no rebound.  Musculoskeletal: Normal range of motion.  Neurological: He is alert and oriented to person, place, and time.       Normal strength BUE/BLE. Normal sensation  Skin: Skin is warm and dry.  Psychiatric: He has a normal mood and affect. His behavior is normal.    ED Course  Procedures (including critical care time) DIAGNOSTIC STUDIES: Oxygen Saturation is 92% on room air, adequate by my interpretation.     Labs Reviewed - No data to display No results found.   No diagnosis found.    MDM  DDX includes: Primary headaches - including migrainous headaches, cluster headaches, tension headaches. ICH Carotid dissection Cavernous sinus thrombosis Meningitis Encephalitis Sinusitis Tumor Vascular headaches AV malformation Brain aneurysm Muscular headaches  A/P: Pt comes in with cc of headaches. No concerns for life threatening secondary headaches because there are no red flags per hx - No nausea, vomiting, visual complains, seizures, altered mental status, loss of consciousness, new weakness, or numbness, no gait instability. He just arrived per EMS for headache, and wants to leave immediately, as his headache is slightly better. He is aox3, demonstrating good judgement. He has no focal neuro deficits on hx or on exam. He is not on any blood thinner, doesn't appear intoxicated, or  under the influence. He is not suicidal or homicidal.         Derwood Kaplan, MD 07/16/12 2212

## 2012-07-16 NOTE — ED Notes (Signed)
Contact person for this pt is his wife who he is separated from.  She requests that she can be contacted if pt needs a ride or for other information.   [phone] Home   Or [phone] Cell

## 2012-07-16 NOTE — ED Notes (Signed)
Chronic headache, states his head is hurting and he has pain in feet

## 2012-07-16 NOTE — ED Notes (Signed)
Pt has a shotgun in his home, so if pt is to be released, family will need to be informed so they can remove the shotgun before patient returns to the home

## 2012-07-22 ENCOUNTER — Emergency Department (HOSPITAL_COMMUNITY)
Admission: EM | Admit: 2012-07-22 | Discharge: 2012-07-23 | Disposition: A | Discharge status: Home / Self Care | Payer: Medicare Other | Attending: Emergency Medicine | Admitting: Emergency Medicine

## 2012-07-22 ENCOUNTER — Encounter (HOSPITAL_COMMUNITY): Payer: Self-pay | Admitting: Emergency Medicine

## 2012-07-22 DIAGNOSIS — M542 Cervicalgia: Secondary | ICD-10-CM | POA: Insufficient documentation

## 2012-07-22 DIAGNOSIS — G8929 Other chronic pain: Secondary | ICD-10-CM | POA: Insufficient documentation

## 2012-07-22 DIAGNOSIS — I10 Essential (primary) hypertension: Secondary | ICD-10-CM | POA: Insufficient documentation

## 2012-07-22 DIAGNOSIS — M549 Dorsalgia, unspecified: Secondary | ICD-10-CM | POA: Insufficient documentation

## 2012-07-22 DIAGNOSIS — R51 Headache: Secondary | ICD-10-CM | POA: Insufficient documentation

## 2012-07-22 DIAGNOSIS — E119 Type 2 diabetes mellitus without complications: Secondary | ICD-10-CM | POA: Insufficient documentation

## 2012-07-22 MED ORDER — HYDROCODONE-ACETAMINOPHEN 5-325 MG PO TABS
2.0000 | ORAL_TABLET | Freq: Once | ORAL | Status: AC
  Administered 2012-07-23: 2 via ORAL
  Filled 2012-07-22: qty 2

## 2012-07-22 MED ORDER — CYCLOBENZAPRINE HCL 10 MG PO TABS
10.0000 mg | ORAL_TABLET | Freq: Once | ORAL | Status: AC
  Administered 2012-07-23: 10 mg via ORAL
  Filled 2012-07-22: qty 1

## 2012-07-22 NOTE — ED Notes (Signed)
Patient complaining of headache for 2 days. States has history of same and takes pain medication regularly for head but is out of pain medication. Reports falling on porch Sunday, but states that his head was hurting before then as well. Denies any loss of consciousness after falling Sunday or any other symptoms.

## 2012-07-22 NOTE — ED Provider Notes (Signed)
History    This chart was scribed for [employer]. Colon Branch, MD, MD by Smitty Pluck. The patient was seen in room APA05 and the patient's care was started at 11:17PM.   CSN: [id number]  Arrival date & time 07/22/12  1942      Chief Complaint  Patient presents with  . Headache    (Consider location/radiation/quality/duration/timing/severity/associated sxs/prior treatment) The history is provided by the patient. No language interpreter was used.   Levi Morris is a 66 y.o. male with a h/o brain tumor s/p surgery who presents to the Emergency Department with hx of chronic head pain complaining of a moderate posterior head pain radiating to bilateral shoulders onset 2 days ago. Pt reports that he does not have his medications (Celebrex and multiple others) due to not having the money and plans on getting refill next Thursday (Oct. 3, 2013). He reports he has appointment with PCP tomorrow.    PCP is Dr. Janna Arch  Past Medical History  Diagnosis Date  . Prostate tumor   . Brain tumor   . Chronic pain   . Hypertension   . Diabetes mellitus   . Back ache     Past Surgical History  Procedure Date  . Brain surgery   . Eye surgery   . Tonsillectomy     History reviewed. No pertinent family history.  History  Substance Use Topics  . Smoking status: Never Smoker   . Smokeless tobacco: Not on file  . Alcohol Use: Yes     occasional      Review of Systems  Constitutional: Negative for fever.       10 Systems reviewed and are negative for acute change except as noted in the HPI.  HENT: Negative for congestion.   Eyes: Negative for discharge and redness.  Respiratory: Negative for cough and shortness of breath.   Cardiovascular: Negative for chest pain.  Gastrointestinal: Negative for vomiting and abdominal pain.  Musculoskeletal: Negative for back pain.  Skin: Negative for rash.  Neurological: Positive for headaches. Negative for syncope and numbness.    Psychiatric/Behavioral:       No behavior change.  All other systems reviewed and are negative.     Allergies  Review of patient's allergies indicates no known allergies.  Home Medications   Current Outpatient Rx  Name Route Sig Dispense Refill  . DULOXETINE HCL 60 MG PO CPEP Oral Take 60 mg by mouth daily.    Marland Kitchen GABAPENTIN 600 MG PO TABS Oral Take 600 mg by mouth 2 (two) times daily.    Marland Kitchen HYDROCODONE-ACETAMINOPHEN 7.5-750 MG PO TABS Oral Take 1 tablet by mouth every 6 (six) hours as needed. For pain    . LORAZEPAM 1 MG PO TABS Oral Take 1 mg by mouth every 4 (four) hours as needed. For nerves    . ABSORBINE JR EX LIQD Topical Apply 1 application topically daily as needed. For pain    . OXCARBAZEPINE 300 MG PO TABS Oral Take 300 mg by mouth every 6 (six) hours.     Marland Kitchen PIOGLITAZONE HCL 30 MG PO TABS Oral Take 30 mg by mouth daily.      [name] OP Ophthalmic Apply 1 drop to eye 4 (four) times daily as needed. FOR dry eye relief    . PREDNISOLONE ACETATE 1 % OP SUSP Left Eye Place 1 drop into the left eye daily. As directed    . TRIAMTERENE-HCTZ 37.5-25 MG PO CAPS Oral Take 1 capsule by mouth  daily. For swelling    . ZOLPIDEM TARTRATE 10 MG PO TABS Oral Take 10 mg by mouth at bedtime as needed. For sleep     . TRAMADOL HCL 50 MG PO TABS Oral Take 50-100 mg by mouth every 6 (six) hours as needed. For pain      BP 151/68  Pulse 95  Temp 98.4 F (36.9 C) (Oral)  Resp 20  Ht 6\' 3"  (1.905 m)  Wt 250 lb (113.399 kg)  BMI 31.25 kg/m2  SpO2 97%  Physical Exam  Nursing note and vitals reviewed. Constitutional: He is oriented to person, place, and time. He appears well-developed and well-nourished. No distress.  HENT:  Head: Atraumatic.  Right Ear: External ear normal.  Left Ear: External ear normal.  Mouth/Throat: Oropharynx is clear and moist.       Misshapen scalp from previous surgery.   Eyes: EOM are normal. Pupils are equal, round, and reactive to light.       Right eye  deviates (normal for patient)  Neck: Normal range of motion. Neck supple. No tracheal deviation present.  Cardiovascular: Normal rate and normal heart sounds.   Pulmonary/Chest: Effort normal and breath sounds normal. No respiratory distress.  Abdominal: Soft. He exhibits no distension.  Musculoskeletal: Normal range of motion.  Neurological: He is alert and oriented to person, place, and time.  Skin: Skin is warm and dry.  Psychiatric: He has a normal mood and affect. His behavior is normal.    ED Course  Procedures (including critical care time) DIAGNOSTIC STUDIES: Oxygen Saturation is 97% on room air, normal by my interpretation.    COORDINATION OF CARE: 11:21 PM Discussed ED treatment with pt  11:45 PM Ordered  Meds ordered this encounter  Medications  . traMADol (ULTRAM) 50 MG tablet    Sig: Take 50-100 mg by mouth every 6 (six) hours as needed. For pain  . HYDROcodone-acetaminophen (VICODIN ES) 7.5-750 MG per tablet    Sig: Take 1 tablet by mouth every 6 (six) hours as needed. For pain  . gabapentin (NEURONTIN) 600 MG tablet    Sig: Take 600 mg by mouth 2 (two) times daily.  Marland Kitchen menthol-thymol (ABSORBINE JR) LIQD    Sig: Apply 1 application topically daily as needed. For pain  . cyclobenzaprine (FLEXERIL) tablet 10 mg    Sig:   . HYDROcodone-acetaminophen (NORCO/VICODIN) 5-325 MG per tablet 2 tablet    Sig:        Labs Reviewed - No data to display No results found.   No diagnosis found.    MDM  Patient with h/o frequent headaches due to previous brain tumor. On multiple medications that he is to get refills on tomorrow when he sees his PCP, Dr. Janna Arch. Given analgesic with relief.  Pt feels improved after observation and/or treatment in ED.Pt stable in ED with no significant deterioration in condition.The patient appears reasonably screened and/or stabilized for discharge and I doubt any other medical condition or other [facility] requiring further screening,  evaluation, or treatment in the ED at this time prior to discharge.  I personally performed the services described in this documentation, which was scribed in my presence. The recorded information has been reviewed and considered.   MDM Reviewed: nursing note, vitals and previous chart              Nicoletta Dress. Colon Branch, MD 07/23/12 [id number]

## 2012-08-11 ENCOUNTER — Emergency Department (HOSPITAL_COMMUNITY)
Admission: EM | Admit: 2012-08-11 | Discharge: 2012-08-11 | Disposition: A | Discharge status: Home / Self Care | Payer: Medicare Other | Attending: Emergency Medicine | Admitting: Emergency Medicine

## 2012-08-11 ENCOUNTER — Encounter (HOSPITAL_COMMUNITY): Payer: Self-pay | Admitting: *Deleted

## 2012-08-11 DIAGNOSIS — G8929 Other chronic pain: Secondary | ICD-10-CM | POA: Insufficient documentation

## 2012-08-11 DIAGNOSIS — R51 Headache: Secondary | ICD-10-CM

## 2012-08-11 DIAGNOSIS — Z8546 Personal history of malignant neoplasm of prostate: Secondary | ICD-10-CM | POA: Insufficient documentation

## 2012-08-11 DIAGNOSIS — M549 Dorsalgia, unspecified: Secondary | ICD-10-CM | POA: Insufficient documentation

## 2012-08-11 DIAGNOSIS — Z85840 Personal history of malignant neoplasm of eye: Secondary | ICD-10-CM | POA: Insufficient documentation

## 2012-08-11 DIAGNOSIS — Z85841 Personal history of malignant neoplasm of brain: Secondary | ICD-10-CM | POA: Insufficient documentation

## 2012-08-11 DIAGNOSIS — I10 Essential (primary) hypertension: Secondary | ICD-10-CM | POA: Insufficient documentation

## 2012-08-11 DIAGNOSIS — E119 Type 2 diabetes mellitus without complications: Secondary | ICD-10-CM | POA: Insufficient documentation

## 2012-08-11 NOTE — ED Notes (Signed)
Upon encountering pt he states, "I'm not sure why I'm here. I thought I was feeling bad but now I think its because I didn't sleep good" Pt is denying pain or HA.

## 2012-08-11 NOTE — ED Notes (Signed)
Chronic headache.

## 2012-08-11 NOTE — ED Provider Notes (Signed)
History   This chart was scribed for Benny Lennert, MD by Gerlean Ren. This patient was seen in room APAH5/APAH5 and the patient's care was started at 18:35.   CSN: [id number]  Arrival date & time 08/11/12  1652   First MD Initiated Contact with Patient 08/11/12 1833      Chief Complaint  Patient presents with  . Headache    (Consider location/radiation/quality/duration/timing/severity/associated sxs/prior treatment) The history is provided by the patient. No language interpreter was used.  Levi Morris is a 66 y.o. male who presents to the Emergency Department complaining of a non-radiating HA that he reports has resolved during exam.  Pt denies any current symptoms and requests discharge.  Pt has h/o HTN, DM, and malignant tumor of right eye.  Pt denies tobacco use but reports alcohol use.     Past Medical History  Diagnosis Date  . Prostate tumor   . Brain tumor   . Chronic pain   . Hypertension   . Diabetes mellitus   . Back ache     Past Surgical History  Procedure Date  . Brain surgery   . Eye surgery   . Tonsillectomy     No family history on file.  History  Substance Use Topics  . Smoking status: Never Smoker   . Smokeless tobacco: Not on file  . Alcohol Use: Yes     occasional      Review of Systems  Constitutional: Negative for fatigue.  HENT: Negative for congestion, sinus pressure and ear discharge.   Eyes: Negative for discharge.  Respiratory: Negative for cough.   Cardiovascular: Negative for chest pain.  Gastrointestinal: Negative for abdominal pain and diarrhea.  Genitourinary: Negative for frequency and hematuria.  Musculoskeletal: Negative for back pain.  Skin: Negative for rash.  Neurological: Positive for headaches. Negative for seizures.  Hematological: Negative.   Psychiatric/Behavioral: Negative for hallucinations.    Allergies  Review of patient's allergies indicates no known allergies.  Home Medications   Current  Outpatient Rx  Name Route Sig Dispense Refill  . DULOXETINE HCL 60 MG PO CPEP Oral Take 60 mg by mouth daily.    Marland Kitchen GABAPENTIN 600 MG PO TABS Oral Take 600 mg by mouth 2 (two) times daily.    Marland Kitchen HYDROCODONE-ACETAMINOPHEN 7.5-750 MG PO TABS Oral Take 1 tablet by mouth every 6 (six) hours as needed. For pain    . LORAZEPAM 1 MG PO TABS Oral Take 1 mg by mouth every 4 (four) hours as needed. For nerves    . ABSORBINE JR EX LIQD Topical Apply 1 application topically daily as needed. For pain    . OXCARBAZEPINE 300 MG PO TABS Oral Take 300 mg by mouth every 6 (six) hours.     Marland Kitchen PIOGLITAZONE HCL 30 MG PO TABS Oral Take 30 mg by mouth daily.      [name] OP Ophthalmic Apply 1 drop to eye 4 (four) times daily as needed. FOR dry eye relief    . PREDNISOLONE ACETATE 1 % OP SUSP Left Eye Place 1 drop into the left eye daily. As directed    . TRAMADOL HCL 50 MG PO TABS Oral Take 50-100 mg by mouth every 6 (six) hours as needed. For pain    . TRIAMTERENE-HCTZ 37.5-25 MG PO CAPS Oral Take 1 capsule by mouth daily. For swelling    . ZOLPIDEM TARTRATE 10 MG PO TABS Oral Take 10 mg by mouth at bedtime as needed. For sleep  BP 132/85  Pulse 98  Temp 97.6 F (36.4 C)  Resp 20  Ht 6\' 3"  (1.905 m)  SpO2 98%  Physical Exam  Nursing note and vitals reviewed. Constitutional: He is oriented to person, place, and time. He appears well-developed.  HENT:  Head: Normocephalic and atraumatic.  Eyes: No scleral icterus.       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  Deformity of right orbit consistent with surgery.   Left eyelid sown shut leaving only small opening.  Impaired view of left eye.  Neck: Neck supple. No thyromegaly present.  Cardiovascular: Normal rate and regular rhythm.  Exam reveals no gallop and no friction rub.   No murmur heard. Pulmonary/Chest: No stridor. He has no wheezes. He has no rales. He exhibits no tenderness.  Abdominal: There is no tenderness.  Musculoskeletal: Normal  range of motion. He exhibits no edema.  Lymphadenopathy:    He has no cervical adenopathy.  Neurological: He is oriented to person, place, and time. Coordination normal.  Skin: No rash noted. No erythema.  Psychiatric: He has a normal mood and affect. His behavior is normal.    ED Course  Procedures (including critical care time) DIAGNOSTIC STUDIES: Oxygen Saturation is 98% on room air, normal by my interpretation.    COORDINATION OF CARE: 18:38- Patient informed of clinical course, understands medical decision-making process, and agrees with plan.  Discussed discharge.      Labs Reviewed - No data to display No results found.   No diagnosis found.    MDM  The chart was scribed for me under my direct supervision.  I personally performed the history, physical, and medical decision making and all procedures in the evaluation of this patient.Benny Lennert, MD 08/11/12 [phone]

## 2012-08-11 NOTE — ED Notes (Signed)
Patient waiting on ride home

## 2012-08-28 ENCOUNTER — Emergency Department (HOSPITAL_COMMUNITY)
Admission: EM | Admit: 2012-08-28 | Discharge: 2012-08-29 | Disposition: A | Discharge status: Home / Self Care | Payer: Medicare Other | Attending: Emergency Medicine | Admitting: Emergency Medicine

## 2012-08-28 ENCOUNTER — Encounter (HOSPITAL_COMMUNITY): Payer: Self-pay | Admitting: Emergency Medicine

## 2012-08-28 DIAGNOSIS — I10 Essential (primary) hypertension: Secondary | ICD-10-CM | POA: Insufficient documentation

## 2012-08-28 DIAGNOSIS — R51 Headache: Secondary | ICD-10-CM

## 2012-08-28 DIAGNOSIS — Z8739 Personal history of other diseases of the musculoskeletal system and connective tissue: Secondary | ICD-10-CM | POA: Insufficient documentation

## 2012-08-28 DIAGNOSIS — E119 Type 2 diabetes mellitus without complications: Secondary | ICD-10-CM | POA: Insufficient documentation

## 2012-08-28 DIAGNOSIS — Z87898 Personal history of other specified conditions: Secondary | ICD-10-CM | POA: Insufficient documentation

## 2012-08-28 DIAGNOSIS — Z79899 Other long term (current) drug therapy: Secondary | ICD-10-CM | POA: Insufficient documentation

## 2012-08-28 HISTORY — DX: Headache: R51

## 2012-08-28 MED ORDER — TRAMADOL HCL 50 MG PO TABS
50.0000 mg | ORAL_TABLET | Freq: Once | ORAL | Status: AC
  Administered 2012-08-28: 50 mg via ORAL
  Filled 2012-08-28: qty 1

## 2012-08-28 NOTE — ED Provider Notes (Signed)
History   This chart was scribed for Dione Booze, MD by Toya Smothers. The patient was seen in room APA04/APA04. Patient's care was started at 2301.  CSN: [id number]  Arrival date & time 08/28/12  2301   First MD Initiated Contact with Patient 08/28/12 2319      Chief Complaint  Patient presents with  . Headache  . Pain   Patient is a 66 y.o. male presenting with headaches. The history is provided by the patient. No language interpreter was used.  Headache  This is a recurrent problem. The current episode started more than 1 week ago. The problem occurs constantly. The problem has been gradually worsening. The pain is located in the occipital, frontal and bilateral region. The pain is at a severity of 9/10. The pain is severe. The pain radiates to the left neck and right neck. The treatment provided no relief.    Levi Morris is a 66 y.o. male with a h/o brain tumor s/p surgery who presents to the Emergency Department complaining of recurrent moderate chronic HA and moderate neck pain. Pain is currently rated (9/10), aggravated by nothing, and alleviated with rest. HA is described as constant, diffuse, and non-radiating. Pt was seen at [facility] ED 1 month ago for similar symptoms and Rx Flexeril, from which he reports no relief. Symptoms have not been treated PTA. Pt reports that he does not have his medications (Tramadol, Hydrocodone, and multiple others).   Pt lists PCP as Dr. Renard Matter and neurologist as Dr. Lennie Hummer   Past Medical History  Diagnosis Date  . Prostate tumor   . Brain tumor   . Chronic pain   . Hypertension   . Diabetes mellitus   . Back ache   . Headache     chronic    Past Surgical History  Procedure Date  . Brain surgery   . Eye surgery   . Tonsillectomy     History reviewed. No pertinent family history.  History  Substance Use Topics  . Smoking status: Never Smoker   . Smokeless tobacco: Not on file  . Alcohol Use: Yes     occasional   Review of  Systems  HENT: Positive for neck pain.   Neurological: Positive for headaches.  All other systems reviewed and are negative.    Allergies  Review of patient's allergies indicates no known allergies.  Home Medications   Current Outpatient Rx  Name Route Sig Dispense Refill  . DULOXETINE HCL 60 MG PO CPEP Oral Take 60 mg by mouth daily.    Marland Kitchen GABAPENTIN 600 MG PO TABS Oral Take 600 mg by mouth 2 (two) times daily.    Marland Kitchen HYDROCODONE-ACETAMINOPHEN 7.5-750 MG PO TABS Oral Take 1 tablet by mouth every 6 (six) hours as needed. For pain    . LORAZEPAM 1 MG PO TABS Oral Take 1 mg by mouth every 4 (four) hours as needed. For nerves    . ABSORBINE JR EX LIQD Topical Apply 1 application topically daily as needed. For pain    . OXCARBAZEPINE 300 MG PO TABS Oral Take 300 mg by mouth every 6 (six) hours.     Marland Kitchen PIOGLITAZONE HCL 30 MG PO TABS Oral Take 30 mg by mouth daily.      [name] OP Ophthalmic Apply 1 drop to eye 4 (four) times daily as needed. FOR dry eye relief    . PREDNISOLONE ACETATE 1 % OP SUSP Left Eye Place 1 drop into the left eye  daily. As directed    . TRAMADOL HCL 50 MG PO TABS Oral Take 50-100 mg by mouth every 6 (six) hours as needed. For pain    . TRIAMTERENE-HCTZ 37.5-25 MG PO CAPS Oral Take 1 capsule by mouth daily. For swelling    . ZOLPIDEM TARTRATE 10 MG PO TABS Oral Take 10 mg by mouth at bedtime as needed. For sleep       BP 131/78  Pulse 100  Resp 14  Ht 6\' 3"  (1.905 m)  Wt 250 lb (113.399 kg)  BMI 31.25 kg/m2  SpO2 99%  Physical Exam  Constitutional: No distress.  HENT:  Mouth/Throat: No oropharyngeal exudate.  Eyes:       Left lids are partly closed - sewn shut. Left pupil is irregular and dilated. Right eye is prosthetic.  Neck:       Moderately tender along both paracervical muscles.  Cardiovascular: Normal rate and regular rhythm.   No murmur heard. Pulmonary/Chest: Effort normal and breath sounds normal. No respiratory distress.  Abdominal: Soft.  Bowel sounds are normal. There is no tenderness.  Musculoskeletal: Normal range of motion.  Neurological: He is alert.  Skin: Skin is warm and dry. No rash noted. He is not diaphoretic.    ED Course  Procedures DIAGNOSTIC STUDIES: Oxygen Saturation is 99% on room air, normal by my interpretation.    COORDINATION OF CARE: 23:24- Evaluated Pt. Pt is awake, alert, and without distress. 23:30- Ordered traMADol (ULTRAM) tablet 50 mg Once.    1. Headache       MDM  Chronic headache. Old charts are reviewed, and he has multiple ED visits for headaches. His given a dose of tramadol with moderate relief of headache. He sat on the prescription for tramadol and also prescription for a small number of Norco to use for those times, was not getting sufficient relief.     I personally performed the services described in this documentation, which was scribed in my presence. The recorded information has been reviewed and considered.       Dione Booze, MD 08/29/12 0020

## 2012-08-28 NOTE — ED Notes (Addendum)
Patient complaining of headache, reports frequent history of same. Reports current headache has been bothering him for approximately a month. Patient also complaining of dental pain and reports possible cavities. Also reports neck pain, foot pain, back pain. Patient also reports drinking 6-7 shots of alcohol tonight pta.

## 2012-08-28 NOTE — ED Notes (Signed)
Patient also reports he would like to see Dr. Gerilyn Pilgrim, patient upset with informed him that Dr. Gerilyn Pilgrim would not see him in ED tonight. Explained to patient why not, patient verbalized understanding and stated, "I want you to call me a cab as soon as possible."

## 2012-08-29 MED ORDER — TRAMADOL HCL 50 MG PO TABS: 50.0000 mg | ORAL_TABLET | Freq: Four times a day (QID) | ORAL | Status: DC | PRN

## 2012-08-29 MED ORDER — HYDROCODONE-ACETAMINOPHEN 5-325 MG PO TABS: 1.0000 | ORAL_TABLET | ORAL | Status: DC | PRN

## 2012-08-29 NOTE — ED Notes (Signed)
Discharge instructions reviewed with pt, questions answered. Pt verbalized understanding.  

## 2012-09-04 ENCOUNTER — Encounter (HOSPITAL_COMMUNITY): Payer: Self-pay | Admitting: *Deleted

## 2012-09-04 ENCOUNTER — Emergency Department (HOSPITAL_COMMUNITY)
Admission: EM | Admit: 2012-09-04 | Discharge: 2012-09-04 | Disposition: A | Discharge status: Home / Self Care | Payer: Medicare Other | Attending: Emergency Medicine | Admitting: Emergency Medicine

## 2012-09-04 DIAGNOSIS — H548 Legal blindness, as defined in USA: Secondary | ICD-10-CM | POA: Insufficient documentation

## 2012-09-04 DIAGNOSIS — G893 Neoplasm related pain (acute) (chronic): Secondary | ICD-10-CM | POA: Insufficient documentation

## 2012-09-04 DIAGNOSIS — R51 Headache: Secondary | ICD-10-CM | POA: Insufficient documentation

## 2012-09-04 DIAGNOSIS — Z8546 Personal history of malignant neoplasm of prostate: Secondary | ICD-10-CM | POA: Insufficient documentation

## 2012-09-04 DIAGNOSIS — I10 Essential (primary) hypertension: Secondary | ICD-10-CM | POA: Insufficient documentation

## 2012-09-04 DIAGNOSIS — E119 Type 2 diabetes mellitus without complications: Secondary | ICD-10-CM | POA: Insufficient documentation

## 2012-09-04 DIAGNOSIS — G8929 Other chronic pain: Secondary | ICD-10-CM

## 2012-09-04 DIAGNOSIS — C719 Malignant neoplasm of brain, unspecified: Secondary | ICD-10-CM | POA: Insufficient documentation

## 2012-09-04 DIAGNOSIS — Z79899 Other long term (current) drug therapy: Secondary | ICD-10-CM | POA: Insufficient documentation

## 2012-09-04 HISTORY — DX: Legal blindness, as defined in USA: H54.8

## 2012-09-04 MED ORDER — TRAMADOL HCL 50 MG PO TABS
100.0000 mg | ORAL_TABLET | Freq: Once | ORAL | Status: AC
  Administered 2012-09-04: 100 mg via ORAL
  Filled 2012-09-04: qty 2

## 2012-09-04 MED ORDER — LORAZEPAM 1 MG PO TABS
1.0000 mg | ORAL_TABLET | Freq: Once | ORAL | Status: AC
  Administered 2012-09-04: 1 mg via ORAL
  Filled 2012-09-04: qty 1

## 2012-09-04 MED ORDER — TRAMADOL HCL 50 MG PO TABS: 50.0000 mg | ORAL_TABLET | Freq: Four times a day (QID) | ORAL | Status: DC | PRN
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
## 2012-09-04 NOTE — ED Notes (Signed)
Continue to await escort for pt to be asured pt's safety to home.

## 2012-09-04 NOTE — ED Notes (Signed)
Dr Rulon Abide again at bedside to examine pt and discuss plan of care with said pt. Pt continues to be accusatory, defensive and agitated.  2 police officers at bedside  And hospital security. Pt will talk to RN with respect once redirected. Pt asking to use phone to call his uncle. Phone privileges denied at this time until pt is respectful.

## 2013-06-30 ENCOUNTER — Encounter (HOSPITAL_COMMUNITY): Payer: Self-pay | Admitting: *Deleted

## 2013-06-30 ENCOUNTER — Emergency Department (HOSPITAL_COMMUNITY)
Admission: EM | Admit: 2013-06-30 | Discharge: 2013-06-30 | Disposition: A | Discharge status: Home / Self Care | Payer: Medicare Other | Attending: Emergency Medicine | Admitting: Emergency Medicine

## 2013-06-30 DIAGNOSIS — I10 Essential (primary) hypertension: Secondary | ICD-10-CM | POA: Insufficient documentation

## 2013-06-30 DIAGNOSIS — K5289 Other specified noninfective gastroenteritis and colitis: Secondary | ICD-10-CM | POA: Insufficient documentation

## 2013-06-30 DIAGNOSIS — Z87448 Personal history of other diseases of urinary system: Secondary | ICD-10-CM | POA: Insufficient documentation

## 2013-06-30 DIAGNOSIS — Z86011 Personal history of benign neoplasm of the brain: Secondary | ICD-10-CM | POA: Insufficient documentation

## 2013-06-30 DIAGNOSIS — Z8669 Personal history of other diseases of the nervous system and sense organs: Secondary | ICD-10-CM | POA: Insufficient documentation

## 2013-06-30 DIAGNOSIS — E119 Type 2 diabetes mellitus without complications: Secondary | ICD-10-CM | POA: Insufficient documentation

## 2013-06-30 DIAGNOSIS — Z79899 Other long term (current) drug therapy: Secondary | ICD-10-CM | POA: Insufficient documentation

## 2013-06-30 DIAGNOSIS — G8929 Other chronic pain: Secondary | ICD-10-CM | POA: Insufficient documentation

## 2013-06-30 DIAGNOSIS — K529 Noninfective gastroenteritis and colitis, unspecified: Secondary | ICD-10-CM

## 2013-06-30 DIAGNOSIS — Z8679 Personal history of other diseases of the circulatory system: Secondary | ICD-10-CM | POA: Insufficient documentation

## 2013-06-30 LAB — CBC WITH DIFFERENTIAL/PLATELET
Eosinophils Absolute: 0 10*3/uL (ref 0.0–0.7)
Eosinophils Relative: 0 % (ref 0–5)
Lymphs Abs: 1.4 10*3/uL (ref 0.7–4.0)
MCH: 32.3 pg (ref 26.0–34.0)
MCV: 90.6 fL (ref 78.0–100.0)
Monocytes Absolute: 0.5 10*3/uL (ref 0.1–1.0)
Platelets: 353 10*3/uL (ref 150–400)
RBC: 4.27 MIL/uL (ref 4.22–5.81)

## 2013-06-30 LAB — BASIC METABOLIC PANEL
CO2: 25 mEq/L (ref 19–32)
Calcium: 9.4 mg/dL (ref 8.4–10.5)
Creatinine, Ser: 0.67 mg/dL (ref 0.50–1.35)
Glucose, Bld: 154 mg/dL — ABNORMAL HIGH (ref 70–99)
Sodium: 121 mEq/L — ABNORMAL LOW (ref 135–145)

## 2013-06-30 MED ORDER — PROMETHAZINE HCL 25 MG PO TABS: 25.0000 mg | ORAL_TABLET | Freq: Four times a day (QID) | ORAL | Status: DC | PRN

## 2013-06-30 MED ORDER — SODIUM CHLORIDE 0.9 % IV BOLUS (SEPSIS)
1000.0000 mL | Freq: Once | INTRAVENOUS | Status: AC
  Administered 2013-06-30: 1000 mL via INTRAVENOUS

## 2013-06-30 MED ORDER — ONDANSETRON HCL 4 MG/2ML IJ SOLN
4.0000 mg | Freq: Once | INTRAMUSCULAR | Status: AC
  Administered 2013-06-30: 4 mg via INTRAVENOUS
  Filled 2013-06-30: qty 2

## 2013-06-30 NOTE — ED Notes (Signed)
Arrives via EMS with c/o n/v onset yesterday.

## 2013-06-30 NOTE — ED Provider Notes (Signed)
CSN: [id number]     Arrival date & time 06/30/13  1748 History  This chart was scribed for Donnetta Hutching, MD by Danella Maiers, ED Scribe. This patient was seen in room APA17/APA17 and the patient's care was started at 6:02 PM.    Chief Complaint  Patient presents with  . Emesis   (Consider location/radiation/quality/duration/timing/severity/associated sxs/prior Treatment) The history is provided by the patient. No language interpreter was used.   HPI Comments: RALPHAEL SOUTHGATE is a 67 y.o. male who presents to the Emergency Department complaining of intermittent, worsening emesis that started this morning with associated generalized abdominal pain. Pt reports more than 10 episodes today. Pt denies diarrhea. Pt is blind in the left eye. He has a history of diabetes.    Past Medical History  Diagnosis Date  . Prostate tumor   . Brain tumor   . Chronic pain   . Hypertension   . Back ache   . Headache(784.0)     chronic  . Legally blind   . Diabetes mellitus    Past Surgical History  Procedure Laterality Date  . Brain surgery    . Eye surgery    . Tonsillectomy     No family history on file. History  Substance Use Topics  . Smoking status: Never Smoker   . Smokeless tobacco: Not on file  . Alcohol Use: Yes     Comment: occasional    Review of Systems A complete 10 system review of systems was obtained and all systems are negative except as noted in the HPI and PMH.    Allergies  Review of patient's allergies indicates no known allergies.  Home Medications   Current Outpatient Rx  Name  Route  Sig  Dispense  Refill  . celecoxib (CELEBREX) 100 MG capsule   Oral   Take 100 mg by mouth 2 (two) times daily.         . DULoxetine (CYMBALTA) 60 MG capsule   Oral   Take 60 mg by mouth daily.         Marland Kitchen HYDROcodone-acetaminophen (NORCO/VICODIN) 5-325 MG per tablet   Oral   Take 1 tablet by mouth every 4 (four) hours as needed for pain.   10 tablet   0   . LORazepam  (ATIVAN) 1 MG tablet   Oral   Take 1 mg by mouth every 4 (four) hours as needed. For nerves         . menthol-thymol (ABSORBINE JR) LIQD   Topical   Apply 1 application topically daily as needed. For pain         . Oxcarbazepine (TRILEPTAL) 300 MG tablet   Oral   Take 300 mg by mouth every 6 (six) hours.          . pioglitazone (ACTOS) 30 MG tablet   Oral   Take 30 mg by mouth daily.           Bertram Gala Glycol-Propyl Glycol (SYSTANE OP)   Ophthalmic   Apply 1 drop to eye 4 (four) times daily as needed. FOR dry eye relief         . prednisoLONE acetate (PRED FORTE) 1 % ophthalmic suspension   Left Eye   Place 1 drop into the left eye daily. As directed         . traMADol (ULTRAM) 50 MG tablet   Oral   Take 1 tablet (50 mg total) by mouth every 6 (six) hours as needed for pain.  20 tablet   0   . traMADol (ULTRAM) 50 MG tablet   Oral   Take 1 tablet (50 mg total) by mouth every 6 (six) hours as needed for pain.   10 tablet   0   . triamterene-hydrochlorothiazide (DYAZIDE) 37.5-25 MG per capsule   Oral   Take 1 capsule by mouth daily. For swelling         . zolpidem (AMBIEN) 10 MG tablet   Oral   Take 10 mg by mouth at bedtime as needed. For sleep           BP 184/92  Pulse 99  Temp(Src) 98.4 F (36.9 C) (Oral)  Resp 20  Ht 6\' 3"  (1.905 m)  Wt 250 lb (113.399 kg)  BMI 31.25 kg/m2  SpO2 99% Physical Exam  Nursing note and vitals reviewed. Constitutional: He is oriented to person, place, and time. He appears well-developed and well-nourished.  HENT:  Head: Normocephalic and atraumatic.  Eyes: Conjunctivae and EOM are normal. Pupils are equal, round, and reactive to light.  Neck: Normal range of motion. Neck supple.  Cardiovascular: Normal rate, regular rhythm and normal heart sounds.   Pulmonary/Chest: Effort normal and breath sounds normal.  Abdominal: Soft. Bowel sounds are normal. There is tenderness (minimal diffuse abdominal  tenderness).  Musculoskeletal: Normal range of motion.  Neurological: He is alert and oriented to person, place, and time.  Skin: Skin is warm and dry.  Psychiatric: He has a normal mood and affect.    ED Course  Procedures (including critical care time) Medications - No data to display  DIAGNOSTIC STUDIES: Oxygen Saturation is 99% on room air, normal by my interpretation.    COORDINATION OF CARE: 6:03 PM- Discussed treatment plan with pt which includes IV fluids, pain management, and blood work and pt agrees to plan.    Labs Review Labs Reviewed  BASIC METABOLIC PANEL - Abnormal; Notable for the following:    Sodium 121 (*)    Chloride 82 (*)    Glucose, Bld 154 (*)    All other components within normal limits  CBC WITH DIFFERENTIAL - Abnormal; Notable for the following:    HCT 38.7 (*)    All other components within normal limits   Imaging Review No results found.  MDM  No diagnosis found. Patient feels much better after IV fluids. Low sodium noted in the past. Discharge meds Phenergan. No acute abdomen     I personally performed the services described in this documentation, which was scribed in my presence. The recorded information has been reviewed and is accurate.    Donnetta Hutching, MD 06/30/13 2102

## 2013-09-08 ENCOUNTER — Ambulatory Visit
Admission: RE | Admit: 2013-09-08 | Discharge: 2013-09-08 | Disposition: A | Discharge status: Home / Self Care | Payer: Medicare Other | Source: Ambulatory Visit | Attending: Osteopathic Medicine | Admitting: Osteopathic Medicine

## 2013-09-08 ENCOUNTER — Other Ambulatory Visit: Payer: Self-pay | Admitting: Osteopathic Medicine

## 2013-09-08 DIAGNOSIS — M4310 Spondylolisthesis, site unspecified: Secondary | ICD-10-CM

## 2014-03-27 ENCOUNTER — Encounter (HOSPITAL_COMMUNITY): Payer: Self-pay | Admitting: Emergency Medicine

## 2014-03-27 ENCOUNTER — Emergency Department (HOSPITAL_COMMUNITY)
Admission: EM | Admit: 2014-03-27 | Discharge: 2014-03-27 | Disposition: A | Discharge status: Home / Self Care | Payer: Medicare HMO | Attending: Emergency Medicine | Admitting: Emergency Medicine

## 2014-03-27 ENCOUNTER — Emergency Department (HOSPITAL_COMMUNITY): Payer: Medicare HMO

## 2014-03-27 DIAGNOSIS — Z79899 Other long term (current) drug therapy: Secondary | ICD-10-CM | POA: Insufficient documentation

## 2014-03-27 DIAGNOSIS — Z87448 Personal history of other diseases of urinary system: Secondary | ICD-10-CM | POA: Insufficient documentation

## 2014-03-27 DIAGNOSIS — E119 Type 2 diabetes mellitus without complications: Secondary | ICD-10-CM | POA: Insufficient documentation

## 2014-03-27 DIAGNOSIS — R0609 Other forms of dyspnea: Secondary | ICD-10-CM | POA: Insufficient documentation

## 2014-03-27 DIAGNOSIS — I10 Essential (primary) hypertension: Secondary | ICD-10-CM | POA: Insufficient documentation

## 2014-03-27 DIAGNOSIS — H539 Unspecified visual disturbance: Secondary | ICD-10-CM | POA: Insufficient documentation

## 2014-03-27 DIAGNOSIS — Z86011 Personal history of benign neoplasm of the brain: Secondary | ICD-10-CM | POA: Insufficient documentation

## 2014-03-27 DIAGNOSIS — R0989 Other specified symptoms and signs involving the circulatory and respiratory systems: Principal | ICD-10-CM | POA: Insufficient documentation

## 2014-03-27 DIAGNOSIS — Z791 Long term (current) use of non-steroidal anti-inflammatories (NSAID): Secondary | ICD-10-CM | POA: Insufficient documentation

## 2014-03-27 DIAGNOSIS — R0600 Dyspnea, unspecified: Secondary | ICD-10-CM

## 2014-03-27 DIAGNOSIS — G8929 Other chronic pain: Secondary | ICD-10-CM | POA: Insufficient documentation

## 2014-03-27 LAB — CBC WITH DIFFERENTIAL/PLATELET
Basophils Absolute: 0.1 10*3/uL (ref 0.0–0.1)
Basophils Relative: 1 % (ref 0–1)
Eosinophils Absolute: 0.2 10*3/uL (ref 0.0–0.7)
Eosinophils Relative: 2 % (ref 0–5)
HEMATOCRIT: 37.9 % — AB (ref 39.0–52.0)
HEMOGLOBIN: 13 g/dL (ref 13.0–17.0)
LYMPHS ABS: 2.7 10*3/uL (ref 0.7–4.0)
Lymphocytes Relative: 29 % (ref 12–46)
MCH: 31.6 pg (ref 26.0–34.0)
MCHC: 34.3 g/dL (ref 30.0–36.0)
MCV: 92 fL (ref 78.0–100.0)
MONO ABS: 0.7 10*3/uL (ref 0.1–1.0)
MONOS PCT: 8 % (ref 3–12)
NEUTROS ABS: 5.6 10*3/uL (ref 1.7–7.7)
NEUTROS PCT: 60 % (ref 43–77)
Platelets: 284 10*3/uL (ref 150–400)
RBC: 4.12 MIL/uL — AB (ref 4.22–5.81)
RDW: 12.8 % (ref 11.5–15.5)
WBC: 9.3 10*3/uL (ref 4.0–10.5)

## 2014-03-27 LAB — BASIC METABOLIC PANEL
BUN: 13 mg/dL (ref 6–23)
CHLORIDE: 100 meq/L (ref 96–112)
CO2: 26 meq/L (ref 19–32)
CREATININE: 0.8 mg/dL (ref 0.50–1.35)
Calcium: 9.6 mg/dL (ref 8.4–10.5)
GFR calc non Af Amer: >90 mL/min (ref >90)
GLUCOSE: 125 mg/dL — AB (ref 70–99)
POTASSIUM: 4 meq/L (ref 3.7–5.3)
Sodium: 139 mEq/L (ref 137–147)

## 2014-03-27 MED ORDER — METFORMIN HCL 1000 MG PO TABS: 1000.0000 mg | ORAL_TABLET | Freq: Two times a day (BID) | ORAL | Status: AC

## 2014-03-27 MED ORDER — METFORMIN HCL 500 MG PO TABS
1000.0000 mg | ORAL_TABLET | Freq: Once | ORAL | Status: AC
  Administered 2014-03-27: 1000 mg via ORAL
  Filled 2014-03-27: qty 2

## 2014-03-27 NOTE — ED Notes (Signed)
C/o shortness of breath since early this morning. Per pt, started "before the sun came up".

## 2014-03-27 NOTE — ED Notes (Signed)
Attempted to pull side rail up on stretcher, pt refused, said needed to go to the bathroom.  Offerred pt a urinal but pt refused.  Pt insisted on walking to restroom.  Maurine Cane assisted.

## 2014-03-27 NOTE — ED Provider Notes (Signed)
CSN: [id number]     Arrival date & time 03/27/14  1309 History  This chart was scribed for Maudry Diego, MD by Zettie Pho, ED Scribe. This patient was seen in room APA12/APA12 and the patient's care was started at 1:29 PM.    Chief Complaint  Patient presents with  . Shortness of Breath   Patient is a 68 y.o. male presenting with shortness of breath. The history is provided by the patient. No language interpreter was used.  Shortness of Breath Severity:  Mild Onset quality:  Gradual Timing:  Constant Progression:  Waxing and waning Chronicity:  New Associated symptoms: no abdominal pain, no chest pain, no cough, no headaches and no rash   Risk factors: hx of cancer    HPI Comments: Levi Morris is a 68 y.o. male with a history of malignant brain tumor (at 38 months old) and blindness in the right eye who presents to the Emergency Department complaining of waxing and waning, mild shortness of breath onset early this morning. Patient states that the shortness of breath has been gradually improving, but has been constant and mild for the past few hours. Patient has a history of HTN and DM. Patient reports some decreased vision in the left eye that he states is new for him. He states that he ran out of his DM medications 3-4 days ago (30 mg Actos).   Past Medical History  Diagnosis Date  . Prostate tumor   . Brain tumor   . Chronic pain   . Hypertension   . Back ache   . Headache(784.0)     chronic  . Legally blind   . Diabetes mellitus    Past Surgical History  Procedure Laterality Date  . Brain surgery    . Eye surgery    . Tonsillectomy     History reviewed. No pertinent family history. History  Substance Use Topics  . Smoking status: Never Smoker   . Smokeless tobacco: Not on file  . Alcohol Use: Yes     Comment: occasional    Review of Systems  Constitutional: Negative for appetite change and fatigue.  HENT: Negative for congestion, ear discharge and sinus  pressure.   Eyes: Positive for visual disturbance. Negative for discharge.  Respiratory: Positive for shortness of breath. Negative for cough.   Cardiovascular: Negative for chest pain.  Gastrointestinal: Negative for abdominal pain and diarrhea.  Genitourinary: Negative for frequency and hematuria.  Musculoskeletal: Negative for back pain.  Skin: Negative for rash.  Neurological: Negative for seizures and headaches.  Psychiatric/Behavioral: Negative for hallucinations.      Allergies  Review of patient's allergies indicates no known allergies.  Home Medications   Prior to Admission medications   Medication Sig Start Date End Date Taking? Authorizing Provider  baclofen (LIORESAL) 10 MG tablet Take 10 mg by mouth 2 (two) times daily.    Historical Provider, MD  celecoxib (CELEBREX) 100 MG capsule Take 100 mg by mouth 2 (two) times daily.    Historical Provider, MD  dorzolamide-timolol (COSOPT) 22.3-6.8 MG/ML ophthalmic solution Place 1 drop into the left eye 2 (two) times daily.    Historical Provider, MD  DULoxetine (CYMBALTA) 60 MG capsule Take 60 mg by mouth daily.    Historical Provider, MD  gabapentin (NEURONTIN) 600 MG tablet Take 600 mg by mouth 4 (four) times daily.    Historical Provider, MD  LORazepam (ATIVAN) 1 MG tablet Take 1 mg by mouth every 4 (four) hours as needed.  For nerves    Historical Provider, MD  menthol-thymol (ABSORBINE JR) LIQD Apply 1 application topically daily as needed. For pain    Historical Provider, MD  Oxcarbazepine (TRILEPTAL) 300 MG tablet Take 300 mg by mouth every 6 (six) hours.     Historical Provider, MD  pioglitazone (ACTOS) 30 MG tablet Take 30 mg by mouth daily.      Historical Provider, MD  Polyethyl Glycol-Propyl Glycol (SYSTANE OP) Apply 1 drop to eye 4 (four) times daily as needed. FOR dry eye relief    Historical Provider, MD  promethazine (PHENERGAN) 25 MG tablet Take 1 tablet (25 mg total) by mouth every 6 (six) hours as needed for  nausea. 06/30/13   Nat Christen, MD  traMADol (ULTRAM) 50 MG tablet Take 50 mg by mouth every 6 (six) hours as needed for pain.    Historical Provider, MD  triamterene-hydrochlorothiazide (DYAZIDE) 37.5-25 MG per capsule Take 1 capsule by mouth daily. For swelling 07/15/12   Historical Provider, MD  zolpidem (AMBIEN) 10 MG tablet Take 10 mg by mouth at bedtime as needed. For sleep     Historical Provider, MD   Triage Vitals: BP 177/97  Temp(Src) 98.8 F (37.1 C) (Oral)  Resp 13  Ht 6\' 3"  (1.905 m)  SpO2 96%  Physical Exam  Constitutional: He is oriented to person, place, and time. He appears well-developed.  HENT:  Head: Normocephalic.  Eyes: Conjunctivae and EOM are normal. No scleral icterus.  Prosthetic right eye. Left eyelid is 2/3 sewn shut.   Neck: Neck supple. No thyromegaly present.  Cardiovascular: Normal rate and regular rhythm.  Exam reveals no gallop and no friction rub.   No murmur heard. Pulmonary/Chest: No stridor. He has no wheezes. He has no rales. He exhibits no tenderness.  Abdominal: He exhibits no distension. There is no tenderness. There is no rebound.  Musculoskeletal: Normal range of motion. He exhibits no edema.  Lymphadenopathy:    He has no cervical adenopathy.  Neurological: He is oriented to person, place, and time. He exhibits normal muscle tone. Coordination normal.  Skin: No rash noted. No erythema.  Psychiatric: He has a normal mood and affect. His behavior is normal.    ED Course  Procedures (including critical care time)  DIAGNOSTIC STUDIES: Oxygen Saturation is 96% on room air, normal by my interpretation.    COORDINATION OF CARE: 1:34 PM- Will order a chest x-ray, CBC, BMP. Discussed treatment plan with patient at bedside and patient verbalized agreement.     Labs Review Labs Reviewed  CBC WITH DIFFERENTIAL - Abnormal; Notable for the following:    RBC 4.12 (*)    HCT 37.9 (*)    All other components within normal limits  BASIC METABOLIC  PANEL - Abnormal; Notable for the following:    Glucose, Bld 125 (*)    All other components within normal limits    Imaging Review Dg Chest 2 View  03/27/2014   CLINICAL DATA:  Short of breath  EXAM: CHEST  2 VIEW  COMPARISON:  02/01/2012  FINDINGS: Normal mediastinum and cardiac silhouette. Normal pulmonary vasculature. No evidence of effusion, infiltrate, or pneumothorax. No acute bony abnormality.  IMPRESSION: Normal chest radiograph   Electronically Signed   By: Suzy Bouchard M.D.   On: 03/27/2014 14:26     EKG Interpretation None      MDM   Final diagnoses:  None    Dyspnea resolved.  Pt to follow up with pcp this week. The chart  was scribed for me under my direct supervision.  I personally performed the history, physical, and medical decision making and all procedures in the evaluation of this patient.Maudry Diego, MD 03/27/14 [phone]

## 2014-03-27 NOTE — Discharge Instructions (Signed)
Follow up with your md next week. °

## 2014-05-04 ENCOUNTER — Encounter (HOSPITAL_COMMUNITY): Payer: Self-pay | Admitting: Emergency Medicine

## 2014-05-04 ENCOUNTER — Emergency Department (HOSPITAL_COMMUNITY)
Admission: EM | Admit: 2014-05-04 | Discharge: 2014-05-04 | Disposition: A | Discharge status: Home / Self Care | Payer: Medicare HMO | Attending: Emergency Medicine | Admitting: Emergency Medicine

## 2014-05-04 DIAGNOSIS — M25559 Pain in unspecified hip: Secondary | ICD-10-CM | POA: Insufficient documentation

## 2014-05-04 DIAGNOSIS — E119 Type 2 diabetes mellitus without complications: Secondary | ICD-10-CM | POA: Insufficient documentation

## 2014-05-04 DIAGNOSIS — Z792 Long term (current) use of antibiotics: Secondary | ICD-10-CM | POA: Insufficient documentation

## 2014-05-04 DIAGNOSIS — F411 Generalized anxiety disorder: Secondary | ICD-10-CM | POA: Insufficient documentation

## 2014-05-04 DIAGNOSIS — Z8669 Personal history of other diseases of the nervous system and sense organs: Secondary | ICD-10-CM | POA: Insufficient documentation

## 2014-05-04 DIAGNOSIS — IMO0002 Reserved for concepts with insufficient information to code with codable children: Secondary | ICD-10-CM | POA: Insufficient documentation

## 2014-05-04 DIAGNOSIS — G8929 Other chronic pain: Secondary | ICD-10-CM | POA: Insufficient documentation

## 2014-05-04 DIAGNOSIS — M25552 Pain in left hip: Secondary | ICD-10-CM

## 2014-05-04 DIAGNOSIS — Z86011 Personal history of benign neoplasm of the brain: Secondary | ICD-10-CM | POA: Insufficient documentation

## 2014-05-04 DIAGNOSIS — I10 Essential (primary) hypertension: Secondary | ICD-10-CM | POA: Insufficient documentation

## 2014-05-04 DIAGNOSIS — Z87448 Personal history of other diseases of urinary system: Secondary | ICD-10-CM | POA: Insufficient documentation

## 2014-05-04 DIAGNOSIS — Z79899 Other long term (current) drug therapy: Secondary | ICD-10-CM | POA: Insufficient documentation

## 2014-05-04 LAB — CBG MONITORING, ED: Glucose-Capillary: 106 mg/dL — ABNORMAL HIGH (ref 70–99)

## 2014-05-04 NOTE — ED Provider Notes (Signed)
CSN: [id number]     Arrival date & time 05/04/14  1814 History  This chart was scribed for Janice Norrie, MD by Roxan Diesel, ED scribe.  This patient was seen in room APA02/APA02 and the patient's care was started at 7:11 PM.    Chief Complaint  Patient presents with  . Shortness of Breath    The history is provided by the patient and the EMS personnel. No language interpreter was used.    HPI Comments: Levi Morris is a 68 y.o. male with h/o brain tumor, DM, and HTN brought in by EMS to the Emergency Department complaining of SOB that began 2 hours ago.  Pt told EMS that he has been feeling anxious all day today and has been having difficulty breathing.  However currently he states his breathing is okay and he complains mainly of left hip pain.  He states he is only having some trouble breathing due to his hip pain.  However he reports has been ongoing for several years.  He does not take any medications for this pain.  He denies CP or cough.  Pt reports he did not take any his medications today and he does not remember when he last took them.  He states he has not run out and he does not know why he has not been taking them.  He states that he wants to go home.  Pt is legally blind.  PCP is Lanette Hampshire, MD   Past Medical History  Diagnosis Date  . Prostate tumor   . Brain tumor   . Chronic pain   . Hypertension   . Back ache   . Headache(784.0)     chronic  . Legally blind   . Diabetes mellitus     Past Surgical History  Procedure Laterality Date  . Brain surgery    . Eye surgery    . Tonsillectomy      No family history on file.   History  Substance Use Topics  . Smoking status: Never Smoker   . Smokeless tobacco: Not on file  . Alcohol Use: Yes     Comment: occasional   Lives at home Lives alone   Review of Systems  Respiratory: Positive for shortness of breath. Negative for cough.   Cardiovascular: Negative for chest pain.  Musculoskeletal: Positive  for arthralgias (hip pain).  Psychiatric/Behavioral: The patient is nervous/anxious.   All other systems reviewed and are negative.     Allergies  Review of patient's allergies indicates no known allergies.  Home Medications   Prior to Admission medications   Medication Sig Start Date End Date Taking? Authorizing Provider  brimonidine (ALPHAGAN P) 0.1 % SOLN Place 1 drop into both eyes every 8 (eight) hours.   Yes Historical Provider, MD  dorzolamide-timolol (COSOPT) 22.3-6.8 MG/ML ophthalmic solution Place 1 drop into the left eye 2 (two) times daily.   Yes Historical Provider, MD  DULoxetine (CYMBALTA) 60 MG capsule Take 60 mg by mouth daily.   Yes Historical Provider, MD  hydrocortisone 2.5 % cream Apply 1 application topically 3 (three) times daily. 05/03/14  Yes Historical Provider, MD  levETIRAcetam (KEPPRA) 1000 MG tablet Take 1 tablet by mouth 2 (two) times daily. 05/02/14  Yes Historical Provider, MD  LORazepam (ATIVAN) 1 MG tablet Take 1 mg by mouth every 4 (four) hours as needed for anxiety.   Yes Historical Provider, MD  metFORMIN (GLUCOPHAGE) 1000 MG tablet Take 1 tablet (1,000 mg total) by mouth  2 (two) times daily. 03/27/14  Yes Maudry Diego, MD  methocarbamol (ROBAXIN) 750 MG tablet Take 750-1,500 mg by mouth every 4 (four) hours as needed for muscle spasms. Max 5 tablets daily.   Yes Historical Provider, MD  mupirocin ointment (BACTROBAN) 2 % Place 1 application into the nose 2 (two) times daily.   Yes Historical Provider, MD  oxyCODONE-acetaminophen (PERCOCET) 10-325 MG per tablet Take 1 tablet by mouth every 4 (four) hours.    Yes Historical Provider, MD  pioglitazone (ACTOS) 30 MG tablet Take 30 mg by mouth daily.     Yes Historical Provider, MD  pregabalin (LYRICA) 300 MG capsule Take 300-600 mg by mouth 2 (two) times daily. Takes 1 capsule in the morning and 2 capsules in the evening   Yes Historical Provider, MD  tiZANidine (ZANAFLEX) 4 MG tablet Take 1 tablet by mouth  4 (four) times daily. 05/02/14  Yes Historical Provider, MD  triamterene-hydrochlorothiazide (MAXZIDE-25) 37.5-25 MG per tablet Take 1 tablet by mouth daily.   Yes Historical Provider, MD   BP 154/92  Pulse 95  Temp(Src) 98.4 F (36.9 C) (Oral)  Resp 17  Wt 250 lb (113.399 kg)  SpO2 96%  Vital signs normal    Physical Exam  Nursing note and vitals reviewed. Constitutional: He is oriented to person, place, and time. He appears well-developed and well-nourished.  Non-toxic appearance. He does not appear ill. No distress.  HENT:  Head: Normocephalic and atraumatic.  Right Ear: External ear normal.  Left Ear: External ear normal.  Eyes:  Left eye is smaller and sunken, right eye does not focus  Neck: Normal range of motion. Neck supple.  Cardiovascular: Normal rate.   Pulmonary/Chest: Effort normal. No respiratory distress.  Abdominal: Normal appearance.  Musculoskeletal: Normal range of motion.  Ambulates without difficulty.  Moves all extremities well.   Neurological: He is alert and oriented to person, place, and time.  Skin: Skin is warm, dry and intact. No rash noted. No erythema. No pallor.  Psychiatric:  Pt agitated    ED Course  Procedures (including critical care time)  DIAGNOSTIC STUDIES: Oxygen Saturation is 96% on room air, normal by my interpretation.    COORDINATION OF CARE: 7:17 PM: As soon as we entered the room patient stated that he wanted to leave.  Pt became angry because he wanted me to come to his house and see him there, and said that I could leave the ER. Will order CBG and discharge home if normal. Pt started walking around in his room and left his room to walk around the ED. Pt states he is ready to be discharged.    Labs Review Results for orders placed during the hospital encounter of 05/04/14  CBG MONITORING, ED      Result Value Ref Range   Glucose-Capillary 106 (*) 70 - 99 mg/dL       Imaging Review No results found.   EKG  Interpretation None      MDM   Final diagnoses:  Hip pain, chronic, left    Plan discharge   Rolland Porter, MD, FACEP  I personally performed the services described in this documentation, which was scribed in my presence. The recorded information has been reviewed and considered.  Rolland Porter, MD, Abram Sander    Janice Norrie, MD 05/04/14 [phone]

## 2014-05-04 NOTE — ED Notes (Signed)
Patient refusing to change into gown or be placed on the monitor. Patient states he doesn't want to be here a "long time." Patient now stating his back hurts.

## 2014-05-04 NOTE — ED Notes (Signed)
Patient states "I want to go home." MD aware.

## 2014-05-04 NOTE — Discharge Instructions (Signed)
Take your medications as prescribed. Recheck as needed.

## 2014-05-04 NOTE — ED Notes (Signed)
Patient arrives via EMS from home with c/o shortness of breath x 1 hour. Patient states feeling anxious all day and has not taken daily medicines. Patient alert/oriented. No distress.

## 2014-05-04 NOTE — ED Notes (Signed)
Spoke with patient's cousin, Mr. Huey Bienenstock, via phone. Stated "I think he needs to be placed in a home somewhere. I am exasperated. I can't help him. He is legally blind, but he can see to care for himself. He is not a cooperative patient. He does not want to be at home, but he doesn't want to go anywhere either." [place] with Social Work a message to follow up with patient at home.

## 2014-05-04 NOTE — ED Notes (Signed)
Assisted patient to bathroom via wheelchair. Stayed at patient's side during urination to prevent patient from falling. Returned to patient's room and placed back in the bed.

## 2014-05-13 NOTE — Care Management Note (Signed)
Pt was seen in the ED recently, and L. Shore left a note for CM to call pt for possibility of placement. He says he would like to go somewhere to live but he does not have the money to pay. He  also states he does not have Medicaid, and does not qualify for it. He tried for LTC medicaid about 2 years ago, and makes about $400 too much. Encouraged to try again every 2-3 years, because the rules do change every now and then and he may qualify now when he did not before. He is blind, but gets around well at home, and has some friends who check on him, but no constant help. Pt was given the phone # for [facility] at [place] and instructed to call for their assistance, and he says he will call them, because he can use the help. He is appreciative of the call and of the information. A friend is there and writes these numbers down for him

## 2014-05-29 ENCOUNTER — Emergency Department (HOSPITAL_COMMUNITY)
Admission: EM | Admit: 2014-05-29 | Discharge: 2014-05-30 | Disposition: A | Discharge status: Home / Self Care | Payer: Medicare HMO | Source: Home / Self Care | Attending: Emergency Medicine | Admitting: Emergency Medicine

## 2014-05-29 ENCOUNTER — Emergency Department (HOSPITAL_COMMUNITY): Payer: Medicare HMO

## 2014-05-29 ENCOUNTER — Encounter (HOSPITAL_COMMUNITY): Payer: Self-pay | Admitting: Emergency Medicine

## 2014-05-29 DIAGNOSIS — T43294A Poisoning by other antidepressants, undetermined, initial encounter: Secondary | ICD-10-CM | POA: Diagnosis not present

## 2014-05-29 DIAGNOSIS — R1084 Generalized abdominal pain: Secondary | ICD-10-CM

## 2014-05-29 DIAGNOSIS — E876 Hypokalemia: Secondary | ICD-10-CM

## 2014-05-29 LAB — COMPREHENSIVE METABOLIC PANEL
ALT: 9 U/L (ref 0–53)
ANION GAP: 14 (ref 5–15)
AST: 19 U/L (ref 0–37)
Albumin: 2.9 g/dL — ABNORMAL LOW (ref 3.5–5.2)
Alkaline Phosphatase: 184 U/L — ABNORMAL HIGH (ref 39–117)
BUN: 15 mg/dL (ref 6–23)
CO2: 31 mEq/L (ref 19–32)
CREATININE: 1.13 mg/dL (ref 0.50–1.35)
Calcium: 8.6 mg/dL (ref 8.4–10.5)
Chloride: 82 mEq/L — ABNORMAL LOW (ref 96–112)
GFR calc non Af Amer: 65 mL/min — ABNORMAL LOW (ref >90)
GFR, EST AFRICAN AMERICAN: 76 mL/min — AB (ref >90)
GLUCOSE: 157 mg/dL — AB (ref 70–99)
Potassium: 2.8 mEq/L — CL (ref 3.7–5.3)
Sodium: 127 mEq/L — ABNORMAL LOW (ref 137–147)
TOTAL PROTEIN: 7.1 g/dL (ref 6.0–8.3)
Total Bilirubin: 0.3 mg/dL (ref 0.3–1.2)

## 2014-05-29 LAB — CBC WITH DIFFERENTIAL/PLATELET
BASOS PCT: 0 % (ref 0–1)
Basophils Absolute: 0 10*3/uL (ref 0.0–0.1)
EOS ABS: 0.1 10*3/uL (ref 0.0–0.7)
EOS PCT: 1 % (ref 0–5)
HEMATOCRIT: 33.8 % — AB (ref 39.0–52.0)
HEMOGLOBIN: 11.8 g/dL — AB (ref 13.0–17.0)
Lymphocytes Relative: 26 % (ref 12–46)
Lymphs Abs: 2.7 10*3/uL (ref 0.7–4.0)
MCH: 30.3 pg (ref 26.0–34.0)
MCHC: 34.9 g/dL (ref 30.0–36.0)
MCV: 86.9 fL (ref 78.0–100.0)
MONO ABS: 0.9 10*3/uL (ref 0.1–1.0)
MONOS PCT: 9 % (ref 3–12)
Neutro Abs: 6.6 10*3/uL (ref 1.7–7.7)
Neutrophils Relative %: 64 % (ref 43–77)
Platelets: 520 10*3/uL — ABNORMAL HIGH (ref 150–400)
RBC: 3.89 MIL/uL — ABNORMAL LOW (ref 4.22–5.81)
RDW: 12.9 % (ref 11.5–15.5)
WBC: 10.4 10*3/uL (ref 4.0–10.5)

## 2014-05-29 LAB — LIPASE, BLOOD: LIPASE: 15 U/L (ref 11–59)

## 2014-05-29 LAB — URINALYSIS, ROUTINE W REFLEX MICROSCOPIC
Glucose, UA: NEGATIVE mg/dL
Hgb urine dipstick: NEGATIVE
Ketones, ur: NEGATIVE mg/dL
Leukocytes, UA: NEGATIVE
NITRITE: NEGATIVE
PROTEIN: NEGATIVE mg/dL
SPECIFIC GRAVITY, URINE: 1.01 (ref 1.005–1.030)
UROBILINOGEN UA: 0.2 mg/dL (ref 0.0–1.0)
pH: 6 (ref 5.0–8.0)

## 2014-05-29 LAB — TROPONIN I: Troponin I: <0.3 ng/mL (ref <0.30)

## 2014-05-29 MED ORDER — POTASSIUM CHLORIDE CRYS ER 20 MEQ PO TBCR
40.0000 meq | EXTENDED_RELEASE_TABLET | Freq: Once | ORAL | Status: AC
  Administered 2014-05-29: 40 meq via ORAL
  Filled 2014-05-29: qty 2

## 2014-05-29 MED ORDER — POTASSIUM CHLORIDE 10 MEQ/100ML IV SOLN
10.0000 meq | Freq: Once | INTRAVENOUS | Status: AC
  Administered 2014-05-29: 10 meq via INTRAVENOUS
  Filled 2014-05-29: qty 100

## 2014-05-29 NOTE — ED Provider Notes (Signed)
CSN: [id number]     Arrival date & time 05/29/14  2001 History  This chart was scribed for Babette Relic, MD by Girtha Hake, ED Scribe. The patient was seen in [place]. The patient's care was started at 8:28 PM.    Chief Complaint  Patient presents with  . Generalized Body Aches  . Abdominal Pain   The history is provided by the patient. No language interpreter was used.   HPI Comments: Levi Morris is a 68 y.o. male with a history of DM who presents to the Emergency Department complaining of mild constant, generalized abdominal pain beginning about two weeks ago. Nothing makes pain worse or better. Patient reports associated constipation for the last two weeks. Patient states that this is unusual for him. He states that he had a loose nonbloody BM today, but otherwise has not moved his bowels for the past two weeks. Voiding normally. At baseline, patient is legally blind, lives alone, and has chronic left hip pain, back pain, chest pain, and leg pain.   Patient does not have any family and reports that a friend checks in on him often. PCP is Dr. Everette Rank.   Past Medical History  Diagnosis Date  . Prostate tumor   . Brain tumor   . Chronic pain   . Hypertension   . Back ache   . Headache(784.0)     chronic  . Legally blind   . Diabetes mellitus    Past Surgical History  Procedure Laterality Date  . Brain surgery    . Eye surgery    . Tonsillectomy     No family history on file. History  Substance Use Topics  . Smoking status: Never Smoker   . Smokeless tobacco: Not on file  . Alcohol Use: Yes     Comment: occasional    Review of Systems 10 Systems reviewed and are negative for acute change except as noted in the HPI.    Allergies  Review of patient's allergies indicates no known allergies.  Home Medications   Prior to Admission medications   Medication Sig Start Date End Date Taking? Authorizing Provider  brimonidine (ALPHAGAN P) 0.1 % SOLN Place 1 drop  into both eyes every 8 (eight) hours.    Historical Provider, MD  dorzolamide-timolol (COSOPT) 22.3-6.8 MG/ML ophthalmic solution Place 1 drop into the left eye 2 (two) times daily.    Historical Provider, MD  DULoxetine (CYMBALTA) 60 MG capsule Take 60 mg by mouth daily.    Historical Provider, MD  hydrocortisone 2.5 % cream Apply 1 application topically 3 (three) times daily. 05/03/14   Historical Provider, MD  levETIRAcetam (KEPPRA) 1000 MG tablet Take 1 tablet by mouth 2 (two) times daily. 05/02/14   Historical Provider, MD  LORazepam (ATIVAN) 1 MG tablet Take 1 mg by mouth every 4 (four) hours as needed for anxiety.    Historical Provider, MD  metFORMIN (GLUCOPHAGE) 1000 MG tablet Take 1 tablet (1,000 mg total) by mouth 2 (two) times daily. 03/27/14   Maudry Diego, MD  methocarbamol (ROBAXIN) 750 MG tablet Take 750-1,500 mg by mouth every 4 (four) hours as needed for muscle spasms. Max 5 tablets daily.    Historical Provider, MD  mupirocin ointment (BACTROBAN) 2 % Place 1 application into the nose 2 (two) times daily.    Historical Provider, MD  oxyCODONE-acetaminophen (PERCOCET) 10-325 MG per tablet Take 1 tablet by mouth every 4 (four) hours.     Historical Provider, MD  pioglitazone (ACTOS) 30 MG tablet Take 30 mg by mouth daily.      Historical Provider, MD  pregabalin (LYRICA) 300 MG capsule Take 300-600 mg by mouth 2 (two) times daily. Takes 1 capsule in the morning and 2 capsules in the evening    Historical Provider, MD  tiZANidine (ZANAFLEX) 4 MG tablet Take 1 tablet by mouth 4 (four) times daily. 05/02/14   Historical Provider, MD  triamterene-hydrochlorothiazide (MAXZIDE-25) 37.5-25 MG per tablet Take 1 tablet by mouth daily.    Historical Provider, MD   Triage Vitals: BP 111/60  Pulse 66  Temp(Src) 98.8 F (37.1 C) (Oral)  Resp 16  SpO2 97% Physical Exam  Nursing note and vitals reviewed. Constitutional:  Awake, alert, nontoxic appearance.  HENT:  Head: Atraumatic.  Eyes:  Right eye exhibits no discharge. Left eye exhibits no discharge.  Neck: Neck supple.  Cardiovascular: Regular rhythm.   No murmur heard. Pulmonary/Chest: Effort normal and breath sounds normal. No respiratory distress. He has no wheezes. He has no rales. He exhibits no tenderness.  Abdominal: Soft. There is no tenderness. There is no rebound.  Genitourinary:  No fecal impaction noted. Light brown, loose stool. Non-tender.  Musculoskeletal: He exhibits no tenderness.  Baseline ROM, no obvious new focal weakness.  Neurological:  Mental status and motor strength appears baseline for patient and situation.  Skin: No rash noted.  Psychiatric: He has a normal mood and affect.    ED Course  Procedures (including critical care time) DIAGNOSTIC STUDIES: Oxygen Saturation is 97% on room air, normal by my interpretation.    COORDINATION OF CARE: 8:41 PM-Discussed treatment plan which includes CXR, pulse oximetry, EKG, cardiac monitoring, and labs  with pt at bedside and pt agreed to plan.     Labs Review Labs Reviewed  CBC WITH DIFFERENTIAL - Abnormal; Notable for the following:    RBC 3.89 (*)    Hemoglobin 11.8 (*)    HCT 33.8 (*)    Platelets 520 (*)    All other components within normal limits  COMPREHENSIVE METABOLIC PANEL - Abnormal; Notable for the following:    Sodium 127 (*)    Potassium 2.8 (*)    Chloride 82 (*)    Glucose, Bld 157 (*)    Albumin 2.9 (*)    Alkaline Phosphatase 184 (*)    GFR calc non Af Amer 65 (*)    GFR calc Af Amer 76 (*)    All other components within normal limits  URINALYSIS, ROUTINE W REFLEX MICROSCOPIC - Abnormal; Notable for the following:    Bilirubin Urine SMALL (*)    All other components within normal limits  LIPASE, BLOOD  TROPONIN I    Imaging Review Dg Abd Acute W/chest  05/29/2014   CLINICAL DATA:  Lower abdominal pain and constipation  EXAM: ACUTE ABDOMEN SERIES (ABDOMEN 2 VIEW & CHEST 1 VIEW)  COMPARISON:  None.  FINDINGS:  Cardiac shadow is within normal limits. The lungs are clear bilaterally. Scattered large and small bowel gas is noted. No obstructive changes are seen. No free air is noted. No bony abnormality is seen.  IMPRESSION: No acute abnormality noted.   Electronically Signed   By: Inez Catalina M.D.   On: 05/29/2014 21:56     EKG Interpretation   Date/Time:  Sunday May 29 2014 21:58:59 EDT Ventricular Rate:  61 PR Interval:  141 QRS Duration: 107 QT Interval:  422 QTC Calculation: 425 R Axis:   27 Text Interpretation:  Sinus  rhythm RSR' in V1 or V2, right VCD or RVH No  significant change since last tracing Confirmed by [facility]  MD, Jenny Reichmann  [phone]) on 05/29/2014 10:04:58 PM      MDM   Final diagnoses:  Generalized abdominal pain  Hypokalemia   I doubt any other EMC precluding discharge at this time including, but not necessarily limited to the following:SBI, peritonitis.  I personally performed the services described in this documentation, which was scribed in my presence. The recorded information has been reviewed and is accurate.    Babette Relic, MD 05/30/14 [phone]

## 2014-05-29 NOTE — ED Notes (Signed)
Patient feels like there are 2 people trying to hurt him. Did not advise who .   Also stated God-Daughter took his medication

## 2014-05-29 NOTE — ED Notes (Signed)
CRITICAL VALUE ALERT  Critical value received: K 2.8  Date of notification:  05/29/14  Time of notification:  2148  Critical value read back:Yes.    Nurse who received alert:  B. Olena Heckle, RN  MD notified (1st page):  PA student with Stevie Kern  Time of first page:  2149  MD notified (2nd page):  Time of second page:  Responding MD:  Stevie Kern  Time MD responded:  2149

## 2014-05-29 NOTE — ED Notes (Signed)
Pt c/o generalized body aches and "pain in my intestines". Denies any N/V/D or appetite loss.

## 2014-05-29 NOTE — ED Notes (Signed)
Per EMS: pt c/o generalized weakness and body aches x 2 weeks.

## 2014-05-30 ENCOUNTER — Encounter (HOSPITAL_COMMUNITY): Payer: Self-pay | Admitting: Emergency Medicine

## 2014-05-30 ENCOUNTER — Inpatient Hospital Stay (HOSPITAL_COMMUNITY)
Admission: EM | Admit: 2014-05-30 | Discharge: 2014-06-03 | DRG: 918 | Disposition: A | Discharge status: Home Health | Payer: Medicare HMO | Attending: Internal Medicine | Admitting: Internal Medicine

## 2014-05-30 DIAGNOSIS — Z79899 Other long term (current) drug therapy: Secondary | ICD-10-CM

## 2014-05-30 DIAGNOSIS — B86 Scabies: Secondary | ICD-10-CM | POA: Diagnosis present

## 2014-05-30 DIAGNOSIS — E119 Type 2 diabetes mellitus without complications: Secondary | ICD-10-CM | POA: Diagnosis present

## 2014-05-30 DIAGNOSIS — G9389 Other specified disorders of brain: Secondary | ICD-10-CM | POA: Diagnosis present

## 2014-05-30 DIAGNOSIS — E46 Unspecified protein-calorie malnutrition: Secondary | ICD-10-CM | POA: Diagnosis present

## 2014-05-30 DIAGNOSIS — E876 Hypokalemia: Secondary | ICD-10-CM | POA: Diagnosis present

## 2014-05-30 DIAGNOSIS — T50904A Poisoning by unspecified drugs, medicaments and biological substances, undetermined, initial encounter: Secondary | ICD-10-CM

## 2014-05-30 DIAGNOSIS — T43294A Poisoning by other antidepressants, undetermined, initial encounter: Principal | ICD-10-CM | POA: Diagnosis present

## 2014-05-30 DIAGNOSIS — Y9210 Unspecified residential institution as the place of occurrence of the external cause: Secondary | ICD-10-CM | POA: Diagnosis present

## 2014-05-30 DIAGNOSIS — F4323 Adjustment disorder with mixed anxiety and depressed mood: Secondary | ICD-10-CM | POA: Diagnosis present

## 2014-05-30 DIAGNOSIS — G8929 Other chronic pain: Secondary | ICD-10-CM | POA: Diagnosis present

## 2014-05-30 DIAGNOSIS — I10 Essential (primary) hypertension: Secondary | ICD-10-CM | POA: Diagnosis present

## 2014-05-30 DIAGNOSIS — T50901A Poisoning by unspecified drugs, medicaments and biological substances, accidental (unintentional), initial encounter: Secondary | ICD-10-CM

## 2014-05-30 DIAGNOSIS — Z6826 Body mass index (BMI) 26.0-26.9, adult: Secondary | ICD-10-CM

## 2014-05-30 DIAGNOSIS — F332 Major depressive disorder, recurrent severe without psychotic features: Secondary | ICD-10-CM | POA: Diagnosis present

## 2014-05-30 DIAGNOSIS — D496 Neoplasm of unspecified behavior of brain: Secondary | ICD-10-CM | POA: Diagnosis present

## 2014-05-30 DIAGNOSIS — I498 Other specified cardiac arrhythmias: Secondary | ICD-10-CM | POA: Diagnosis present

## 2014-05-30 DIAGNOSIS — D473 Essential (hemorrhagic) thrombocythemia: Secondary | ICD-10-CM

## 2014-05-30 DIAGNOSIS — F1911 Other psychoactive substance abuse, in remission: Secondary | ICD-10-CM | POA: Diagnosis present

## 2014-05-30 DIAGNOSIS — R109 Unspecified abdominal pain: Secondary | ICD-10-CM | POA: Diagnosis present

## 2014-05-30 DIAGNOSIS — Z87898 Personal history of other specified conditions: Secondary | ICD-10-CM

## 2014-05-30 DIAGNOSIS — E871 Hypo-osmolality and hyponatremia: Secondary | ICD-10-CM | POA: Diagnosis present

## 2014-05-30 DIAGNOSIS — H548 Legal blindness, as defined in USA: Secondary | ICD-10-CM | POA: Diagnosis present

## 2014-05-30 DIAGNOSIS — T43591A Poisoning by other antipsychotics and neuroleptics, accidental (unintentional), initial encounter: Secondary | ICD-10-CM | POA: Diagnosis present

## 2014-05-30 DIAGNOSIS — T50902A Poisoning by unspecified drugs, medicaments and biological substances, intentional self-harm, initial encounter: Secondary | ICD-10-CM

## 2014-05-30 DIAGNOSIS — D75839 Thrombocytosis, unspecified: Secondary | ICD-10-CM | POA: Diagnosis present

## 2014-05-30 DIAGNOSIS — Z85841 Personal history of malignant neoplasm of brain: Secondary | ICD-10-CM

## 2014-05-30 LAB — RAPID URINE DRUG SCREEN, HOSP PERFORMED
Amphetamines: NOT DETECTED
Barbiturates: NOT DETECTED
Benzodiazepines: NOT DETECTED
Cocaine: NOT DETECTED
Opiates: NOT DETECTED
TETRAHYDROCANNABINOL: NOT DETECTED

## 2014-05-30 LAB — MRSA PCR SCREENING: MRSA BY PCR: NEGATIVE

## 2014-05-30 LAB — ETHANOL

## 2014-05-30 LAB — CBC
HEMATOCRIT: 34.8 % — AB (ref 39.0–52.0)
Hemoglobin: 11.8 g/dL — ABNORMAL LOW (ref 13.0–17.0)
MCH: 29.4 pg (ref 26.0–34.0)
MCHC: 33.9 g/dL (ref 30.0–36.0)
MCV: 86.6 fL (ref 78.0–100.0)
Platelets: 497 10*3/uL — ABNORMAL HIGH (ref 150–400)
RBC: 4.02 MIL/uL — ABNORMAL LOW (ref 4.22–5.81)
RDW: 13 % (ref 11.5–15.5)
WBC: 9.6 10*3/uL (ref 4.0–10.5)

## 2014-05-30 LAB — COMPREHENSIVE METABOLIC PANEL
ALT: 8 U/L (ref 0–53)
ANION GAP: 15 (ref 5–15)
AST: 19 U/L (ref 0–37)
Albumin: 2.8 g/dL — ABNORMAL LOW (ref 3.5–5.2)
Alkaline Phosphatase: 194 U/L — ABNORMAL HIGH (ref 39–117)
BUN: 13 mg/dL (ref 6–23)
CALCIUM: 9.1 mg/dL (ref 8.4–10.5)
CO2: 28 mEq/L (ref 19–32)
Chloride: 86 mEq/L — ABNORMAL LOW (ref 96–112)
Creatinine, Ser: 0.96 mg/dL (ref 0.50–1.35)
GFR calc Af Amer: >90 mL/min (ref >90)
GFR calc non Af Amer: 84 mL/min — ABNORMAL LOW (ref >90)
Glucose, Bld: 216 mg/dL — ABNORMAL HIGH (ref 70–99)
Potassium: 2.8 mEq/L — CL (ref 3.7–5.3)
Sodium: 129 mEq/L — ABNORMAL LOW (ref 137–147)
TOTAL PROTEIN: 7.1 g/dL (ref 6.0–8.3)
Total Bilirubin: 0.3 mg/dL (ref 0.3–1.2)

## 2014-05-30 LAB — GLUCOSE, CAPILLARY: Glucose-Capillary: 101 mg/dL — ABNORMAL HIGH (ref 70–99)

## 2014-05-30 LAB — ACETAMINOPHEN LEVEL

## 2014-05-30 LAB — SALICYLATE LEVEL: Salicylate Lvl: <2 mg/dL — ABNORMAL LOW (ref 2.8–20.0)

## 2014-05-30 LAB — MAGNESIUM: Magnesium: 2 mg/dL (ref 1.5–2.5)

## 2014-05-30 MED ORDER — FOLIC ACID 5 MG/ML IJ SOLN
1.0000 mg | Freq: Every day | INTRAMUSCULAR | Status: DC
  Administered 2014-05-30 – 2014-05-31 (×2): 1 mg via INTRAVENOUS
  Filled 2014-05-30 (×6): qty 0.2

## 2014-05-30 MED ORDER — SODIUM CHLORIDE 0.9 % IV SOLN: INTRAVENOUS | Status: DC

## 2014-05-30 MED ORDER — ACETAMINOPHEN 650 MG RE SUPP: 650.0000 mg | Freq: Four times a day (QID) | RECTAL | Status: DC | PRN

## 2014-05-30 MED ORDER — ONDANSETRON HCL 4 MG PO TABS
4.0000 mg | ORAL_TABLET | Freq: Four times a day (QID) | ORAL | Status: DC | PRN
  Administered 2014-05-30: 4 mg via ORAL
  Filled 2014-05-30: qty 1

## 2014-05-30 MED ORDER — SODIUM CHLORIDE 0.9 % IV SOLN
Freq: Once | INTRAVENOUS | Status: AC
  Administered 2014-05-30: 16:00:00 via INTRAVENOUS

## 2014-05-30 MED ORDER — PERMETHRIN 5 % EX CREA
TOPICAL_CREAM | Freq: Once | CUTANEOUS | Status: AC
  Administered 2014-05-30: 18:00:00 via TOPICAL
  Filled 2014-05-30: qty 60

## 2014-05-30 MED ORDER — NALOXONE HCL 0.4 MG/ML IJ SOLN
0.4000 mg | Freq: Once | INTRAMUSCULAR | Status: AC
  Administered 2014-05-30: 0.4 mg via INTRAVENOUS
  Filled 2014-05-30: qty 1

## 2014-05-30 MED ORDER — PNEUMOCOCCAL VAC POLYVALENT 25 MCG/0.5ML IJ INJ
0.5000 mL | INJECTION | INTRAMUSCULAR | Status: DC
  Filled 2014-05-30 (×2): qty 0.5

## 2014-05-30 MED ORDER — THIAMINE HCL 100 MG/ML IJ SOLN
100.0000 mg | Freq: Every day | INTRAMUSCULAR | Status: DC
  Administered 2014-05-30 – 2014-05-31 (×2): 100 mg via INTRAVENOUS
  Filled 2014-05-30 (×2): qty 2
  Filled 2014-05-30 (×3): qty 1

## 2014-05-30 MED ORDER — INSULIN ASPART 100 UNIT/ML ~~LOC~~ SOLN: 0.0000 [IU] | Freq: Every day | SUBCUTANEOUS | Status: DC

## 2014-05-30 MED ORDER — HEPARIN SODIUM (PORCINE) 5000 UNIT/ML IJ SOLN
5000.0000 [IU] | Freq: Three times a day (TID) | INTRAMUSCULAR | Status: DC
  Administered 2014-05-30 – 2014-06-02 (×2): 5000 [IU] via SUBCUTANEOUS
  Filled 2014-05-30 (×13): qty 1

## 2014-05-30 MED ORDER — ONDANSETRON HCL 4 MG/2ML IJ SOLN: 4.0000 mg | Freq: Four times a day (QID) | INTRAMUSCULAR | Status: DC | PRN

## 2014-05-30 MED ORDER — POTASSIUM CHLORIDE 10 MEQ/100ML IV SOLN
10.0000 meq | INTRAVENOUS | Status: AC
  Administered 2014-05-30 (×3): 10 meq via INTRAVENOUS
  Filled 2014-05-30 (×3): qty 100

## 2014-05-30 MED ORDER — BRIMONIDINE TARTRATE 0.15 % OP SOLN
1.0000 [drp] | Freq: Three times a day (TID) | OPHTHALMIC | Status: DC
  Administered 2014-05-30 – 2014-06-03 (×3): 1 [drp] via OPHTHALMIC
  Filled 2014-05-30: qty 5

## 2014-05-30 MED ORDER — SODIUM CHLORIDE 0.9 % IV BOLUS (SEPSIS)
1000.0000 mL | Freq: Once | INTRAVENOUS | Status: AC
  Administered 2014-05-30: 1000 mL via INTRAVENOUS

## 2014-05-30 MED ORDER — INSULIN ASPART 100 UNIT/ML ~~LOC~~ SOLN
0.0000 [IU] | Freq: Three times a day (TID) | SUBCUTANEOUS | Status: DC
  Administered 2014-06-01 – 2014-06-02 (×4): 2 [IU] via SUBCUTANEOUS
  Administered 2014-06-03: 1 [IU] via SUBCUTANEOUS

## 2014-05-30 MED ORDER — POTASSIUM CHLORIDE IN NACL 40-0.9 MEQ/L-% IV SOLN
INTRAVENOUS | Status: DC
  Administered 2014-05-30 – 2014-05-31 (×3): 125 mL/h via INTRAVENOUS
  Filled 2014-05-30 (×10): qty 1000

## 2014-05-30 MED ORDER — ACETAMINOPHEN 325 MG PO TABS
650.0000 mg | ORAL_TABLET | Freq: Four times a day (QID) | ORAL | Status: DC | PRN
  Administered 2014-05-30 – 2014-05-31 (×2): 650 mg via ORAL
  Filled 2014-05-30 (×3): qty 2

## 2014-05-30 MED ORDER — DULOXETINE HCL 60 MG PO CPEP
60.0000 mg | ORAL_CAPSULE | Freq: Every day | ORAL | Status: DC
  Administered 2014-05-31 – 2014-06-03 (×4): 60 mg via ORAL
  Filled 2014-05-30 (×2): qty 1
  Filled 2014-05-30: qty 2
  Filled 2014-05-30: qty 1

## 2014-05-30 MED ORDER — SODIUM CHLORIDE 0.9 % IJ SOLN
3.0000 mL | Freq: Two times a day (BID) | INTRAMUSCULAR | Status: DC
  Administered 2014-05-31: 3 mL via INTRAVENOUS

## 2014-05-30 MED ORDER — LEVETIRACETAM 500 MG PO TABS
1000.0000 mg | ORAL_TABLET | Freq: Two times a day (BID) | ORAL | Status: DC
  Administered 2014-05-30 – 2014-06-03 (×6): 1000 mg via ORAL
  Filled 2014-05-30 (×9): qty 2

## 2014-05-30 MED ORDER — DORZOLAMIDE HCL-TIMOLOL MAL 2-0.5 % OP SOLN
1.0000 [drp] | Freq: Two times a day (BID) | OPHTHALMIC | Status: DC
  Administered 2014-05-30 – 2014-06-03 (×3): 1 [drp] via OPHTHALMIC
  Filled 2014-05-30 (×2): qty 10
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
## 2014-05-30 NOTE — Discharge Instructions (Signed)
Hypokalemia Hypokalemia means that the amount of potassium in the blood is lower than normal.Potassium is a chemical, called an electrolyte, that helps regulate the amount of fluid in the body. It also stimulates muscle contraction and helps nerves function properly.Most of the body's potassium is inside of cells, and only a very small amount is in the blood. Because the amount in the blood is so small, minor changes can be life-threatening. CAUSES  Antibiotics.  Diarrhea or vomiting.  Using laxatives too much, which can cause diarrhea.  Chronic kidney disease.  Water pills (diuretics).  Eating disorders (bulimia).  Low magnesium level.  Sweating a lot. SIGNS AND SYMPTOMS  Weakness.  Constipation.  Fatigue.  Muscle cramps.  Mental confusion.  Skipped heartbeats or irregular heartbeat (palpitations).  Tingling or numbness. DIAGNOSIS  Your health care provider can diagnose hypokalemia with blood tests. In addition to checking your potassium level, your health care provider may also check other lab tests. TREATMENT Hypokalemia can be treated with potassium supplements taken by mouth or adjustments in your current medicines. If your potassium level is very low, you may need to get potassium through a vein (IV) and be monitored in the hospital. A diet high in potassium is also helpful. Foods high in potassium are:  Nuts, such as peanuts and pistachios.  Seeds, such as sunflower seeds and pumpkin seeds.  Peas, lentils, and lima beans.  Whole grain and bran cereals and breads.  Fresh fruit and vegetables, such as apricots, avocado, bananas, cantaloupe, kiwi, oranges, tomatoes, asparagus, and potatoes.  Orange and tomato juices.  Red meats.  Fruit yogurt. HOME CARE INSTRUCTIONS  Take all medicines as prescribed by your health care provider.  Maintain a healthy diet by including nutritious food, such as fruits, vegetables, nuts, whole grains, and lean meats.  If  you are taking a laxative, be sure to follow the directions on the label. SEEK MEDICAL CARE IF:  Your weakness gets worse.  You feel your heart pounding or racing.  You are vomiting or having diarrhea.  You are diabetic and having trouble keeping your blood glucose in the normal range. SEEK IMMEDIATE MEDICAL CARE IF:  You have chest pain, shortness of breath, or dizziness.  You are vomiting or having diarrhea for more than 2 days.  You faint. MAKE SURE YOU:   Understand these instructions.  Will watch your condition.  Will get help right away if you are not doing well or get worse. Document Released: 10/14/2005 Document Revised: 08/04/2013 Document Reviewed: 04/16/2013 [facility] Patient Information 2015 [place], [place]. This information is not intended to replace advice given to you by your health care provider. Make sure you discuss any questions you have with your health care provider.  Abdominal (belly) pain can be caused by many things. Your caregiver performed an examination and possibly ordered blood/urine tests and imaging (CT scan, x-rays, ultrasound). Many cases can be observed and treated at home after initial evaluation in the emergency department. Even though you are being discharged home, abdominal pain can be unpredictable. Therefore, you need a repeated exam if your pain does not resolve, returns, or worsens. Most patients with abdominal pain don't have to be admitted to the hospital or have surgery, but serious problems like appendicitis and gallbladder attacks can start out as nonspecific pain. Many abdominal conditions cannot be diagnosed in one visit, so follow-up evaluations are very important. SEEK IMMEDIATE MEDICAL ATTENTION IF: The pain does not go away or becomes severe.  A temperature above 101 develops.  Repeated vomiting occurs (multiple episodes).  The pain becomes localized to portions of the abdomen. The right side could possibly be appendicitis. In an  adult, the left lower portion of the abdomen could be colitis or diverticulitis.  Blood is being passed in stools or vomit (bright red or black tarry stools).  Return also if you develop chest pain, difficulty breathing, dizziness or fainting, or become confused, poorly responsive, or inconsolable (young children).

## 2014-05-30 NOTE — ED Provider Notes (Signed)
CSN: [id number]     Arrival date & time 05/30/14  1311 History   First MD Initiated Contact with Patient 05/30/14 1318     Chief Complaint  Patient presents with  . Ingestion     (Consider location/radiation/quality/duration/timing/severity/associated sxs/prior Treatment) HPI Comments: Per report, pt ingested a "handful" of Trazodone pills while in the waiting room at his pain clinic at about 12:30 PM.  It is unclear if this was a suicide attempt. He initially denies taking pills, but they tells he is is tired of his current medical situation.   Patient is a 68 y.o. male presenting with Ingested Medication. The history is provided by the EMS personnel. The history is limited by the absence of a caregiver and the condition of the patient.  Ingestion This is a new problem. The current episode started less than 1 hour ago. The problem occurs constantly. The problem has been gradually worsening. Associated symptoms comments: Unknown . Nothing aggravates the symptoms. Nothing relieves the symptoms. He has tried nothing for the symptoms. The treatment provided no relief.    Past Medical History  Diagnosis Date  . Prostate tumor   . Brain tumor   . Chronic pain   . Hypertension   . Back ache   . Headache(784.0)     chronic  . Legally blind   . Diabetes mellitus    Past Surgical History  Procedure Laterality Date  . Brain surgery    . Eye surgery    . Tonsillectomy     History reviewed. No pertinent family history. History  Substance Use Topics  . Smoking status: Never Smoker   . Smokeless tobacco: Never Used  . Alcohol Use: Yes     Comment: occasional    Review of Systems  Unable to perform ROS: Mental status change      Allergies  Poison oak extract  Home Medications   Prior to Admission medications   Medication Sig Start Date End Date Taking? Authorizing Provider  brimonidine (ALPHAGAN P) 0.1 % SOLN Place 1 drop into both eyes every 8 (eight) hours.   Yes  Historical Provider, MD  dorzolamide-timolol (COSOPT) 22.3-6.8 MG/ML ophthalmic solution Place 1 drop into the left eye 2 (two) times daily.   Yes Historical Provider, MD  DULoxetine (CYMBALTA) 60 MG capsule Take 60 mg by mouth daily.   Yes Historical Provider, MD  levETIRAcetam (KEPPRA) 1000 MG tablet Take 1 tablet by mouth 2 (two) times daily. 05/02/14  Yes Historical Provider, MD  LORazepam (ATIVAN) 1 MG tablet Take 1 mg by mouth every 4 (four) hours as needed for anxiety.   Yes Historical Provider, MD  metFORMIN (GLUCOPHAGE) 1000 MG tablet Take 1 tablet (1,000 mg total) by mouth 2 (two) times daily. 03/27/14  Yes Maudry Diego, MD  methocarbamol (ROBAXIN) 750 MG tablet Take 750-1,500 mg by mouth every 4 (four) hours as needed for muscle spasms. Max 5 tablets daily.   Yes Historical Provider, MD  ondansetron (ZOFRAN) 4 MG tablet Take 4 mg by mouth every 8 (eight) hours as needed for nausea or vomiting.   Yes Historical Provider, MD  oxyCODONE-acetaminophen (PERCOCET) 10-325 MG per tablet Take 1 tablet by mouth every 4 (four) hours.    Yes Historical Provider, MD  pioglitazone (ACTOS) 30 MG tablet Take 30 mg by mouth daily.     Yes Historical Provider, MD  pregabalin (LYRICA) 300 MG capsule Take 300-600 mg by mouth 2 (two) times daily. Takes 1 capsule in the morning and  2 capsules in the evening   Yes Historical Provider, MD  triamterene-hydrochlorothiazide (MAXZIDE-25) 37.5-25 MG per tablet Take 1 tablet by mouth daily.   Yes Historical Provider, MD   BP 170/79  Pulse 94  Temp(Src) 99.1 F (37.3 C) (Axillary)  Resp 16  Ht 6\' 3"  (1.905 m)  Wt 210 lb 5.1 oz (95.4 kg)  BMI 26.29 kg/m2  SpO2 96% Physical Exam  Constitutional: He appears lethargic. He appears ill. No distress.  discheveled  HENT:  Head: Normocephalic and atraumatic.  Mouth/Throat: No oropharyngeal exudate.  Eyes: Pupils are equal, round, and reactive to light.  Enucleation of R eye  Neck: Normal range of motion. Neck  supple.  Cardiovascular: Regular rhythm and normal heart sounds.  Bradycardia present.  Exam reveals no gallop and no friction rub.   No murmur heard. Pulmonary/Chest: Effort normal and breath sounds normal. No respiratory distress. He has no wheezes. He has no rales.  Abdominal: Soft. Bowel sounds are normal. He exhibits no distension and no mass. There is no tenderness. There is no rebound and no guarding.  Musculoskeletal: Normal range of motion. He exhibits no edema and no tenderness.  Neurological: He appears lethargic. GCS eye subscore is 2. GCS verbal subscore is 4. GCS motor subscore is 6.  Skin: Skin is warm and dry.  Psychiatric: He has a normal mood and affect.    ED Course  Procedures (including critical care time) Labs Review Labs Reviewed  CBC - Abnormal; Notable for the following:    RBC 4.02 (*)    Hemoglobin 11.8 (*)    HCT 34.8 (*)    Platelets 497 (*)    All other components within normal limits  COMPREHENSIVE METABOLIC PANEL - Abnormal; Notable for the following:    Sodium 129 (*)    Potassium 2.8 (*)    Chloride 86 (*)    Glucose, Bld 216 (*)    Albumin 2.8 (*)    Alkaline Phosphatase 194 (*)    GFR calc non Af Amer 84 (*)    All other components within normal limits  SALICYLATE LEVEL - Abnormal; Notable for the following:    Salicylate Lvl <0.3 (*)    All other components within normal limits  BASIC METABOLIC PANEL - Abnormal; Notable for the following:    Sodium 135 (*)    Potassium 3.4 (*)    Glucose, Bld 124 (*)    GFR calc non Af Amer 89 (*)    All other components within normal limits  CBC - Abnormal; Notable for the following:    RBC 4.07 (*)    Hemoglobin 11.9 (*)    HCT 35.4 (*)    Platelets 512 (*)    All other components within normal limits  HEMOGLOBIN A1C - Abnormal; Notable for the following:    Hemoglobin A1C 6.8 (*)    Mean Plasma Glucose 148 (*)    All other components within normal limits  GLUCOSE, CAPILLARY - Abnormal; Notable  for the following:    Glucose-Capillary 101 (*)    All other components within normal limits  GLUCOSE, CAPILLARY - Abnormal; Notable for the following:    Glucose-Capillary 146 (*)    All other components within normal limits  GLUCOSE, CAPILLARY - Abnormal; Notable for the following:    Glucose-Capillary 145 (*)    All other components within normal limits  MRSA PCR SCREENING  ETHANOL  ACETAMINOPHEN LEVEL  URINE RAPID DRUG SCREEN (HOSP PERFORMED)  MAGNESIUM    Imaging  Review Dg Abd Acute W/chest  05/29/2014   CLINICAL DATA:  Lower abdominal pain and constipation  EXAM: ACUTE ABDOMEN SERIES (ABDOMEN 2 VIEW & CHEST 1 VIEW)  COMPARISON:  None.  FINDINGS: Cardiac shadow is within normal limits. The lungs are clear bilaterally. Scattered large and small bowel gas is noted. No obstructive changes are seen. No free air is noted. No bony abnormality is seen.  IMPRESSION: No acute abnormality noted.   Electronically Signed   By: Inez Catalina M.D.   On: 05/29/2014 21:56     EKG Interpretation   Date/Time:  Monday May 30 2014 13:14:26 EDT Ventricular Rate:  54 PR Interval:  158 QRS Duration: 98 QT Interval:  424 QTC Calculation: 402 R Axis:   33 Text Interpretation:  Sinus rhythm RSR' in V1 or V2, right VCD or RVH No  significant change since last tracing Confirmed by [place]  MD, Boykin  [phone]) on 05/31/2014 1:25:10 PM      MDM   Final diagnoses:  Ingested substance, unknown drug, intentional self-harm, initial encounter  Brain tumor  Chronic pain   Pt is a 68 y.o. male with Pmhx as above who presents after ingestion of a handful of pills at the waiting room of his pain clinic which were reported to be Trazodone at about 12:30PM.  Pt is somnolent, will awaken only to noxious stimuli, cannot appropriately answer questions about the ingestion, but states he is tired of his current medical condition. He is intermittently bradycardia, but BP and O2 sats stable. Interestingly, I do not  see trazodone on his med list.    Spoke w/ Ebony Hail at [facility] poison control, who rec checking for co-ingestants, benzos for seizures, checking lytes including Mag, and monitoring for at least 6 hrs if this is indeed a Trazodone ingestion. Labs revealing significant e-lyte abnormalities including Na 129 and K 2.8.  Given he cannot tolerate PO, IV KCL initiated and triad consulted to admission for e-lte correction. He will need psych consult once able to participate.    Neta Ehlers, MD 05/31/14 1325

## 2014-05-30 NOTE — ED Notes (Signed)
Pt sleepy, responds to pain. Keeps falling back asleep prior to fully answering questions. Pain management provider told EMS he has taken too much of ativan before.

## 2014-05-30 NOTE — Progress Notes (Signed)
Patient has a bag full of meds that need to be locked up in the Pharmacy.

## 2014-05-30 NOTE — ED Notes (Signed)
Attempted in and out cath. Pt woke up and screamed. Catheter taken out, no urine return. Pt given urinal. Pt promptly fell back asleep.

## 2014-05-30 NOTE — ED Notes (Signed)
Pt from pain management clinic. Pt took handfull of trazadone while in pain management waiting room about 45 minutes ago. Pt sleepy, but alert when he wakes up.

## 2014-05-31 DIAGNOSIS — F332 Major depressive disorder, recurrent severe without psychotic features: Secondary | ICD-10-CM | POA: Diagnosis present

## 2014-05-31 DIAGNOSIS — Z79899 Other long term (current) drug therapy: Secondary | ICD-10-CM | POA: Diagnosis not present

## 2014-05-31 DIAGNOSIS — E119 Type 2 diabetes mellitus without complications: Secondary | ICD-10-CM | POA: Diagnosis present

## 2014-05-31 DIAGNOSIS — R109 Unspecified abdominal pain: Secondary | ICD-10-CM | POA: Diagnosis present

## 2014-05-31 DIAGNOSIS — E871 Hypo-osmolality and hyponatremia: Secondary | ICD-10-CM | POA: Diagnosis present

## 2014-05-31 DIAGNOSIS — E46 Unspecified protein-calorie malnutrition: Secondary | ICD-10-CM | POA: Diagnosis present

## 2014-05-31 DIAGNOSIS — F4323 Adjustment disorder with mixed anxiety and depressed mood: Secondary | ICD-10-CM | POA: Diagnosis present

## 2014-05-31 DIAGNOSIS — T43591A Poisoning by other antipsychotics and neuroleptics, accidental (unintentional), initial encounter: Secondary | ICD-10-CM | POA: Diagnosis present

## 2014-05-31 DIAGNOSIS — H548 Legal blindness, as defined in USA: Secondary | ICD-10-CM | POA: Diagnosis present

## 2014-05-31 DIAGNOSIS — D473 Essential (hemorrhagic) thrombocythemia: Secondary | ICD-10-CM | POA: Diagnosis present

## 2014-05-31 DIAGNOSIS — G8929 Other chronic pain: Secondary | ICD-10-CM | POA: Diagnosis present

## 2014-05-31 DIAGNOSIS — Z85841 Personal history of malignant neoplasm of brain: Secondary | ICD-10-CM | POA: Diagnosis not present

## 2014-05-31 DIAGNOSIS — Y9210 Unspecified residential institution as the place of occurrence of the external cause: Secondary | ICD-10-CM | POA: Diagnosis present

## 2014-05-31 DIAGNOSIS — E876 Hypokalemia: Secondary | ICD-10-CM | POA: Diagnosis present

## 2014-05-31 DIAGNOSIS — Z6826 Body mass index (BMI) 26.0-26.9, adult: Secondary | ICD-10-CM | POA: Diagnosis not present

## 2014-05-31 DIAGNOSIS — G9389 Other specified disorders of brain: Secondary | ICD-10-CM | POA: Diagnosis present

## 2014-05-31 DIAGNOSIS — D496 Neoplasm of unspecified behavior of brain: Secondary | ICD-10-CM | POA: Diagnosis present

## 2014-05-31 DIAGNOSIS — I10 Essential (primary) hypertension: Secondary | ICD-10-CM | POA: Diagnosis present

## 2014-05-31 DIAGNOSIS — T43294A Poisoning by other antidepressants, undetermined, initial encounter: Secondary | ICD-10-CM | POA: Diagnosis present

## 2014-05-31 DIAGNOSIS — I498 Other specified cardiac arrhythmias: Secondary | ICD-10-CM | POA: Diagnosis present

## 2014-05-31 DIAGNOSIS — B86 Scabies: Secondary | ICD-10-CM | POA: Diagnosis present

## 2014-05-31 LAB — CBC
HCT: 35.4 % — ABNORMAL LOW (ref 39.0–52.0)
Hemoglobin: 11.9 g/dL — ABNORMAL LOW (ref 13.0–17.0)
MCH: 29.2 pg (ref 26.0–34.0)
MCHC: 33.6 g/dL (ref 30.0–36.0)
MCV: 87 fL (ref 78.0–100.0)
Platelets: 512 10*3/uL — ABNORMAL HIGH (ref 150–400)
RBC: 4.07 MIL/uL — AB (ref 4.22–5.81)
RDW: 13.2 % (ref 11.5–15.5)
WBC: 9 10*3/uL (ref 4.0–10.5)

## 2014-05-31 LAB — HEMOGLOBIN A1C
Hgb A1c MFr Bld: 6.8 % — ABNORMAL HIGH (ref <5.7)
MEAN PLASMA GLUCOSE: 148 mg/dL — AB (ref <117)

## 2014-05-31 LAB — BASIC METABOLIC PANEL
ANION GAP: 12 (ref 5–15)
BUN: 8 mg/dL (ref 6–23)
CO2: 27 mEq/L (ref 19–32)
Calcium: 8.9 mg/dL (ref 8.4–10.5)
Chloride: 96 mEq/L (ref 96–112)
Creatinine, Ser: 0.83 mg/dL (ref 0.50–1.35)
GFR calc Af Amer: >90 mL/min (ref >90)
GFR calc non Af Amer: 89 mL/min — ABNORMAL LOW (ref >90)
GLUCOSE: 124 mg/dL — AB (ref 70–99)
POTASSIUM: 3.4 meq/L — AB (ref 3.7–5.3)
SODIUM: 135 meq/L — AB (ref 137–147)

## 2014-05-31 LAB — GLUCOSE, CAPILLARY
GLUCOSE-CAPILLARY: 130 mg/dL — AB (ref 70–99)
GLUCOSE-CAPILLARY: 136 mg/dL — AB (ref 70–99)
Glucose-Capillary: 146 mg/dL — ABNORMAL HIGH (ref 70–99)
Glucose-Capillary: 145 mg/dL — ABNORMAL HIGH (ref 70–99)

## 2014-05-31 MED ORDER — LORAZEPAM 2 MG/ML IJ SOLN
1.0000 mg | Freq: Four times a day (QID) | INTRAMUSCULAR | Status: DC | PRN
  Administered 2014-05-31: 1 mg via INTRAVENOUS
  Filled 2014-05-31: qty 1

## 2014-05-31 MED ORDER — AMLODIPINE BESYLATE 5 MG PO TABS
5.0000 mg | ORAL_TABLET | Freq: Every day | ORAL | Status: DC
  Administered 2014-05-31 – 2014-06-03 (×3): 5 mg via ORAL
  Filled 2014-05-31 (×4): qty 1

## 2014-05-31 MED ORDER — ENSURE COMPLETE PO LIQD
237.0000 mL | Freq: Three times a day (TID) | ORAL | Status: DC
  Administered 2014-06-02 – 2014-06-03 (×3): 237 mL via ORAL

## 2014-05-31 MED ORDER — HALOPERIDOL LACTATE 5 MG/ML IJ SOLN
INTRAMUSCULAR | Status: AC
  Administered 2014-05-31: 5 mg via INTRAVENOUS
  Filled 2014-05-31: qty 1

## 2014-05-31 MED ORDER — HALOPERIDOL LACTATE 5 MG/ML IJ SOLN
5.0000 mg | Freq: Once | INTRAMUSCULAR | Status: AC
  Administered 2014-05-31: 5 mg via INTRAVENOUS

## 2014-05-31 MED ORDER — LORAZEPAM 1 MG PO TABS
2.0000 mg | ORAL_TABLET | Freq: Once | ORAL | Status: AC
  Administered 2014-05-31: 2 mg via ORAL
  Filled 2014-05-31: qty 2

## 2014-05-31 NOTE — Progress Notes (Signed)
Patient ID: Levi Morris, male   DOB: 1946-08-18, 68 y.o.   MRN: [id number] [facility] PROGRESS NOTE  Levi Morris TGG:[id number] DOB: 1946-02-08 DOA: 05/30/2014 PCP: Lanette Hampshire, MD  Brief narrative: 67 y.o. 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 Initial workup included abdominal x-ray which did not reveal acute abnormalities. Lab work showed multiple electrolyte abnormalities including hyponatremia and hypokalemia. He was subsequently discharged home but came back 05/30/2014 for overdose on trazodone.  Assessment/Plan:    Principal Problem:  Ingestion of unknown drug / overdose  Suspected overdose with trazodone tablets. Urine drug screen negative. Tylenol and salicylate levels not elevated. Patient's mental status did not change over past 24 hours. He remains confused.  Appreciate psychiatry consult and recommendations. Patient is medically stable to be transferred to a telemetry floor today. Active Problems:  Brain tumor  Last MRI done 03/25/12 which showed: Areas of encephalomalacia related to previous surgery involving the left temporal lobe and the left petrous apex. Continue Keppra 1000 mg by mouth twice a day for seizure prophylaxis  Chronic pain  Hold opiates until mental status improves. Suspect Scabies  Permethrin 5% ordered. Will need reapplication in one week. Hypertension  Initially, blood pressure medications were on hold. Blood pressure is 186/75 this morning. Added Norvasc 5 mg daily. Type 2 diabetes mellitus  A1c on this admission is 6.8 indicating good glycemic control. Continue sliding scale insulin. CBGs in past 24 hours: 101, 146 Hyponatremia  May be from dehydration. Continue IV fluids but reduce the rate from 125 cc an hour down to 75 cc an hour. Sodium is 135 this morning. Hypokalemia  Magnesium WNL. Potassium being repleted. Potassium 3.4 this  morning. History of substance abuse  UDS and ethanol levels negative. Continue folic acid, thiamine. Unspecified protein-calorie malnutrition  Appreciate dietitian recommendations. Continue ensure supplementation. Thrombocytosis  Likely reactive or reflective of hemoconcentration. Monitor. DVT prophylaxis  Subcutaneous heparin ordered.   Code Status: Full.  Family Communication: No family at the bedside this am.  Disposition Plan: transfer to telemetry floor today. Order in place.    IV Access:   Peripheral IV Procedures and diagnostic studies:   DG abdomen 05/29/2014 - no acute abnormalities Medical Consultants:   Dr. Ardath Sax, Psychiatry  Other Consultants:   Nutritionist  Anti-Infectives:   None    Leisa Lenz, MD  [facility] Pager [phone]  If 7PM-7AM, please contact night-coverage [email or web address] Password [email or web address] 05/31/2014, 8:06 AM   LOS: 1 day    HPI/Subjective: No acute overnight events.  Objective: Filed Vitals:   05/31/14 0455 05/31/14 0500 05/31/14 0600 05/31/14 0650  BP:      Pulse:   66 66  Temp:      TempSrc:      Resp:  20 18 22   Height:      Weight: 95.4 kg (210 lb 5.1 oz)     SpO2:   88% 90%    Intake/Output Summary (Last 24 hours) at 05/31/14 0806 Last data filed at 05/31/14 0753  Gross per 24 hour  Intake 2059.58 ml  Output   1550 ml  Net 509.58 ml    Exam:   General:  Pt is alert, follows commands appropriately, not in acute distress  Cardiovascular: Regular rate and rhythm, S1/S2, no murmurs  Respiratory: Clear to auscultation bilaterally, no wheezing, no crackles, no rhonchi  Abdomen: Soft, non tender, non distended, bowel  sounds present  Extremities: No edema, pulses DP and PT palpable bilaterally  Skin: multiple scattered punctate lesions on LE seems to come from bruising   Neuro: Grossly nonfocal  Data Reviewed: Basic Metabolic Panel:  Recent Labs Lab 05/29/14 2101 05/30/14 1324  05/30/14 1358 05/31/14 0345  NA 127* 129*  --  135*  K 2.8* 2.8*  --  3.4*  CL 82* 86*  --  96  CO2 31 28  --  27  GLUCOSE 157* 216*  --  124*  BUN 15 13  --  8  CREATININE 1.13 0.96  --  0.83  CALCIUM 8.6 9.1  --  8.9  MG  --   --  2.0  --    Liver Function Tests:  Recent Labs Lab 05/29/14 2101 05/30/14 1324  AST 19 19  ALT 9 8  ALKPHOS 184* 194*  BILITOT 0.3 0.3  PROT 7.1 7.1  ALBUMIN 2.9* 2.8*    Recent Labs Lab 05/29/14 2101  LIPASE 15   No results found for this basename: AMMONIA,  in the last 168 hours CBC:  Recent Labs Lab 05/29/14 2101 05/30/14 1324 05/31/14 0345  WBC 10.4 9.6 9.0  NEUTROABS 6.6  --   --   HGB 11.8* 11.8* 11.9*  HCT 33.8* 34.8* 35.4*  MCV 86.9 86.6 87.0  PLT 520* 497* 512*   Cardiac Enzymes:  Recent Labs Lab 05/29/14 2101  TROPONINI <0.30   BNP: No components found with this basename: POCBNP,  CBG:  Recent Labs Lab 05/30/14 2110  GLUCAP 101*    MRSA PCR SCREENING     Status: None   Collection Time    05/30/14  4:39 PM      Result Value Ref Range Status   MRSA by PCR NEGATIVE  NEGATIVE Final     Studies: Dg Abd Acute W/chest 05/29/2014    IMPRESSION: No acute abnormality noted.   Electronically Signed   By: Inez Catalina M.D.   On: 05/29/2014 21:56    Scheduled Meds: . dorzolamide-timolol  1 drop Left Eye BID  . DULoxetine  60 mg Oral Daily  . folic acid  1 mg Intravenous Daily  . heparin  5,000 Units Subcutaneous 3 times per day  . insulin aspart  0-5 Units Subcutaneous QHS  . insulin aspart  0-9 Units Subcutaneous TID WC  . levETIRAcetam  1,000 mg Oral BID  . thiamine  100 mg Intravenous Daily   Continuous Infusions: . 0.9 % NaCl with KCl 40 mEq / L 125 mL/hr (05/31/14 0231)

## 2014-05-31 NOTE — Progress Notes (Signed)
Clinical Social Work Department CLINICAL SOCIAL WORK PSYCHIATRY SERVICE LINE ASSESSMENT 05/31/2014  Patient:  Levi Morris  Account:  [id number]  Admit Date:  05/30/2014  Clinical Social Worker:  Sindy Messing, LCSW  Date/Time:  05/31/2014 10:00 AM Referred by:  Physician  Date referred:  05/31/2014 Reason for Referral  Psychosocial assessment   Presenting Symptoms/Problems (In the person's/family's own words):   Psych consulted due to overdose.   Abuse/Neglect/Trauma History (check all that apply)  Denies history   Abuse/Neglect/Trauma Comments:   Psychiatric History (check all that apply)  Denies history   Psychiatric medications:  Cymbalta 60 mg  Ativan 1 mg   Current Mental Health Hospitalizations/Previous Mental Health History:   Patient denies any mental health history. Per patient's cousin, patient has been diagnosed with anxiety in the past and PCP prescribes medication.   Current provider:   PCP   Place and Date:   [place], [place]   Current Medications:   Scheduled Meds:      . sodium chloride   Intravenous STAT  . amLODipine  5 mg Oral Daily  . brimonidine  1 drop Right Eye 3 times per day  . dorzolamide-timolol  1 drop Left Eye BID  . DULoxetine  60 mg Oral Daily  . feeding supplement (ENSURE COMPLETE)  237 mL Oral TID BM  . folic acid  1 mg Intravenous Daily  . heparin  5,000 Units Subcutaneous 3 times per day  . insulin aspart  0-5 Units Subcutaneous QHS  . insulin aspart  0-9 Units Subcutaneous TID WC  . levETIRAcetam  1,000 mg Oral BID  . pneumococcal 23 valent vaccine  0.5 mL Intramuscular Tomorrow-1000  . sodium chloride  3 mL Intravenous Q12H  . thiamine  100 mg Intravenous Daily        Continuous Infusions:      . 0.9 % NaCl with KCl 40 mEq / L 125 mL/hr (05/31/14 1018)          PRN Meds:.acetaminophen, acetaminophen, LORazepam, ondansetron (ZOFRAN) IV, ondansetron       Previous Impatient Admission/Date/Reason:   None reported   Emotional  Health / Current Symptoms    Suicide/Self Harm  Suicidal ideation (ex: "I can't take any more,I wish I could disappear")   Suicide attempt in the past:   Patient denies any SI or HI. Patient denies any previous attempts and denies that he took pills at pain clinic on day of admission. Cousin reports that patient has made statements such as "I might as well be dead." Cousin called 20 at one point due to safety concerns but is unaware of outcomes.  Cousin reports that patient does have access to guns at home.   Other harmful behavior:   None reported   Psychotic/Dissociative Symptoms  None reported   Other Psychotic/Dissociative Symptoms:    Attention/Behavioral Symptoms  Withdrawn   Other Attention / Behavioral Symptoms:   Patient is upset about hospitalization. Patient keeps his eyes closed and turns his head away from CSW.    Cognitive Impairment  Orientation - Place  Orientation - Self   Other Cognitive Impairment:   Patient is alert to self and that he is currently in the hospital.    Mood and Adjustment  Aggressive/frustrated    Stress, Anxiety, Trauma, Any Recent Loss/Stressor  None reported   Anxiety (frequency):   N/A   Phobia (specify):   N/A   Compulsive behavior (specify):   N/A   Obsessive behavior (specify):   N/A  Other:   N/A   Substance Abuse/Use  None   SBIRT completed (please refer for detailed history):  N  Self-reported substance use:   Patient denies all substance use and refuses to complete SBIRT. Patient's cousin is unsure if patient uses any substances.   Urinary Drug Screen Completed:  Y Alcohol level:   <11    Environmental/Housing/Living Arrangement  Stable housing   Who is in the home:   Alone   Emergency contact:  Levi Morris   Financial  Medicare   Patient's Strengths and Goals (patient's own words):   Patient reports that he is independent and does not need CSW assistance.   Clinical Social Worker's  Interpretive Summary:   CSW received referral in order to complete psychosocial assessment. CSW reviewed chart and met with patient at bedside. CSW introduced myself and explained role.    Patient laying in bed with eyes closed. Patient reports he is not sure why he is at the hospital and states that he wants to leave today. Patient reports that he lives at home alone and friends and family assist as needed. Patient is unable to drive so patient gets assistance with rides to doctor appointments and to run errands. Patient reports he was married for over 53 years but he is no longer with wife and does not have any children.    Patient denies any history of anxiety or depression. Patient reports he does not take any medication and reports others are lying that he took medication at the pain clinic. Patient denies any SA. Patient denies any SI or HI or previous attempts. Patient contracts for safety and reports he just wants to DC today. Patient agreeable for CSW to contact his cousin Levi Morris) for further information.    CSW spoke with cousin who reports that patient lives at home alone but that his house is deteriorating and patient is unable to care for house. Patient's house is infested with roaches and bed bugs. Patient has running water and electricity but his bathroom does not have good plumbing and patient is unable to shower most of the times. Patient gets Meals on Wheels but often does not eat the meals.    Cousin confirmed that patient is married and that wife left several years ago to live with her mother. Patient still talks with wife occasionally. Patient has friends and family that assist as times but cousin has tried to convince patient to go to ALF because he is not properly caring for himself. Patient does not take medication as prescribed. Patient has agreed to go and tour ALF and reports he is agreeable but then changes his mind and stays at home. Cousin asked if CSW could place patient in ALF  so CSW explained that patient would have to be agreeable to placement and at this time, patient wants to return home.    Cousin has a list of patient's medications and reports he was diagnosed with anxiety and depression several years ago. Patient does not see a psychiatrist but PCP prescribes medications. Cousin reports that patient has passive SI but is unaware of any plans or attempts in the past.    CSW will staff case with psych MD and will assist with any recommendations provided.   Disposition:  Recommend Psych CSW continuing to support while in hospital   [place], [place] [phone]

## 2014-05-31 NOTE — Plan of Care (Signed)
Problem: Consults Goal: Skin Care Protocol Initiated - if Braden Score 18 or less If consults are not indicated, leave blank or document N/A  Outcome: Progressing Scars or bites noted on legs and arms, . Dr Charlies Silvers aware.
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
## 2014-05-31 NOTE — Progress Notes (Signed)
Patient very agitated at this time, multiple attempts to reorient patient unsuccessful. Patient yelling out, attempting to climb from bed, pulling off gown. PRN ativan given per order for agitation.

## 2014-05-31 NOTE — Progress Notes (Signed)
INITIAL NUTRITION ASSESSMENT  DOCUMENTATION CODES Per approved criteria  -Not Applicable   INTERVENTION: - Ensure Complete TID - Sitter plans to encourage PO intake - Recommend nursing re-weigh pt  - RD to continue to monitor   NUTRITION DIAGNOSIS: Inadequate oral intake related to poor appetite as evidenced by <25% meal intake per sitter.   Goal: Pt to consume >90% of meals and supplements  Monitor:  Weights, labs, intake  Reason for Assessment: Consult for assessment   68 y.o. male  Admitting Dx: Ingestion of unknown drug  ASSESSMENT: Pt with PMH of DM, substance abuse, and brain cancer. Pt legally blind. Pt apparently went to a pain clinic yesterday, and was witnessed taking a handful of Trazodone tablets. Pt with sitter at bedside.  - Attempted to obtain diet history from pt however on asking how he was eating at home, he shrugged his shoulders and didn't answer - Shook head "yes" when asked if his appetite was poor and wasn't sure about changes in weight - Sitter reports pt didn't eat his breakfast however did drink some apple juice - Upper body did not appear malnourished however did not perform nutrition focused physical exam as pt went back to sleep - Weight trend shows pt's weight down 40 pounds in the past month, unsure of accuracy    Alk phos elevated Potassium slightly low, getting IV replacement    Height: Ht Readings from Last 1 Encounters:  05/30/14 6' 3" (1.905 m)    Weight: Wt Readings from Last 1 Encounters:  05/31/14 210 lb 5.1 oz (95.4 kg)    Ideal Body Weight: 196 lbs   % Ideal Body Weight: 107%  Wt Readings from Last 10 Encounters:  05/31/14 210 lb 5.1 oz (95.4 kg)  05/04/14 250 lb (113.399 kg)  06/30/13 250 lb (113.399 kg)  08/28/12 250 lb (113.399 kg)  07/22/12 250 lb (113.399 kg)  05/19/12 250 lb (113.399 kg)  08/07/11 245 lb (111.131 kg)  07/31/11 245 lb (111.131 kg)  05/09/11 226 lb 12.8 oz (102.876 kg)    Usual Body Weight:  250 lbs   % Usual Body Weight: 84%  BMI:  Body mass index is 26.29 kg/(m^2).  Estimated Nutritional Needs: Kcal: 2250-2450 Protein: 120-140g Fluid: per MD  Skin: Intact   Diet Order: General  EDUCATION NEEDS: -No education needs identified at this time   Intake/Output Summary (Last 24 hours) at 05/31/14 1026 Last data filed at 05/31/14 0952  Gross per 24 hour  Intake 2309.58 ml  Output   2125 ml  Net 184.58 ml    Last BM: 8/2  Labs:   Recent Labs Lab 05/29/14 2101 05/30/14 1324 05/30/14 1358 05/31/14 0345  NA 127* 129*  --  135*  K 2.8* 2.8*  --  3.4*  CL 82* 86*  --  96  CO2 31 28  --  27  BUN 15 13  --  8  CREATININE 1.13 0.96  --  0.83  CALCIUM 8.6 9.1  --  8.9  MG  --   --  2.0  --   GLUCOSE 157* 216*  --  124*    CBG (last 3)   Recent Labs  05/30/14 2110 05/31/14 0855  GLUCAP 101* 146*    Scheduled Meds: . sodium chloride   Intravenous STAT  . amLODipine  5 mg Oral Daily  . brimonidine  1 drop Right Eye 3 times per day  . dorzolamide-timolol  1 drop Left Eye BID  . DULoxetine  60 mg Oral Daily  . folic acid  1 mg Intravenous Daily  . heparin  5,000 Units Subcutaneous 3 times per day  . insulin aspart  0-5 Units Subcutaneous QHS  . insulin aspart  0-9 Units Subcutaneous TID WC  . levETIRAcetam  1,000 mg Oral BID  . pneumococcal 23 valent vaccine  0.5 mL Intramuscular Tomorrow-1000  . sodium chloride  3 mL Intravenous Q12H  . thiamine  100 mg Intravenous Daily    Continuous Infusions: . 0.9 % NaCl with KCl 40 mEq / L 125 mL/hr (05/31/14 1018)    Past Medical History  Diagnosis Date  . Prostate tumor   . Brain tumor   . Chronic pain   . Hypertension   . Back ache   . Headache(784.0)     chronic  . Legally blind   . Diabetes mellitus     Past Surgical History  Procedure Laterality Date  . Brain surgery    . Eye surgery    . Tonsillectomy      Carlis Stable MS, RD, LDN [phone] Pager [phone] Weekend/After Hours  Pager

## 2014-05-31 NOTE — Progress Notes (Signed)
CARE MANAGEMENT NOTE 05/31/2014  Patient:  Levi [facility], Levi Morris   Account Number:  [id number]  Date Initiated:  05/31/2014  Documentation initiated by:  DAVIS,RHONDA  Subjective/Objective Assessment:   Ingestion of unknown drug / overdose  Urine drug screen negative. Tylenol and salicylate levels not elevated.  Suspected trazodone overdose.  Admit for observation, recheck electrolytes in a.m.  Psychiatry consultation.      Active Problems     Action/Plan:   psych hospital versus home versus snf  pt has no local family and emergency contacts not answering   Anticipated DC Date:  06/03/2014   Anticipated DC Plan:  [place] referral  Clinical Social Worker      DC Planning Services  NA      [facility] Choice  NA   Choice offered to / List presented to:  NA   DME arranged  NA      DME agency  NA     [place] arranged  NA      [place] agency  NA   Status of service:  In process, will continue to follow Medicare Important Message given?  NA - LOS <3 / Initial given by admissions (If response is "NO", the following Medicare IM given date fields will be blank) Date Medicare IM given:   Medicare IM given by:   Date Additional Medicare IM given:   Additional Medicare IM given by:    Discharge Disposition:    Per UR Regulation:  Reviewed for med. necessity/level of care/duration of stay  If discussed at [place] of Stay Meetings, dates discussed:    Comments:  [id number]/Rhonda Eldridge Dace, [place], [place] [phone] Chart Reviewed for discharge and hospital needs. Discharge needs at time of review: None present will follow for needs. Review of patient progress due on [id number].

## 2014-05-31 NOTE — Progress Notes (Signed)
Patient received as transfer to room 1443, patient able to self transfer from bed to bed with a good amount of instruction. Patient did become agitated during transfer, allowed for patient to rest, sitter remains at bedside.

## 2014-06-01 LAB — GLUCOSE, CAPILLARY
Glucose-Capillary: 170 mg/dL — ABNORMAL HIGH (ref 70–99)
Glucose-Capillary: 182 mg/dL — ABNORMAL HIGH (ref 70–99)
Glucose-Capillary: 177 mg/dL — ABNORMAL HIGH (ref 70–99)
Glucose-Capillary: 158 mg/dL — ABNORMAL HIGH (ref 70–99)

## 2014-06-01 LAB — CBC
HCT: 37.6 % — ABNORMAL LOW (ref 39.0–52.0)
HEMOGLOBIN: 12.7 g/dL — AB (ref 13.0–17.0)
MCH: 29.5 pg (ref 26.0–34.0)
MCHC: 33.8 g/dL (ref 30.0–36.0)
MCV: 87.2 fL (ref 78.0–100.0)
Platelets: 555 10*3/uL — ABNORMAL HIGH (ref 150–400)
RBC: 4.31 MIL/uL (ref 4.22–5.81)
RDW: 13.4 % (ref 11.5–15.5)
WBC: 12.3 10*3/uL — ABNORMAL HIGH (ref 4.0–10.5)

## 2014-06-01 LAB — BASIC METABOLIC PANEL
Anion gap: 15 (ref 5–15)
BUN: 4 mg/dL — ABNORMAL LOW (ref 6–23)
CO2: 28 mEq/L (ref 19–32)
Calcium: 9.2 mg/dL (ref 8.4–10.5)
Chloride: 93 mEq/L — ABNORMAL LOW (ref 96–112)
Creatinine, Ser: 0.72 mg/dL (ref 0.50–1.35)
GFR calc Af Amer: >90 mL/min (ref >90)
GFR calc non Af Amer: >90 mL/min (ref >90)
GLUCOSE: 144 mg/dL — AB (ref 70–99)
POTASSIUM: 3.3 meq/L — AB (ref 3.7–5.3)
SODIUM: 136 meq/L — AB (ref 137–147)

## 2014-06-01 NOTE — Progress Notes (Signed)
Patient has been refusing heparin injections since last night. Explained to him the importance of heparin and still refused to have it.

## 2014-06-01 NOTE — Progress Notes (Signed)
Patient ID: KASSEM KIBBE, male   DOB: Mar 14, 1946, 68 y.o.   MRN: [id number] [facility] PROGRESS NOTE  SHAWNTA ZIMBELMAN ALP:[id number] DOB: Jan 01, 1946 DOA: 05/30/2014 PCP: Lanette Hampshire, MD  Brief narrative: 68 y.o. 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 Initial workup included abdominal x-ray which did not reveal acute abnormalities. Lab work showed multiple electrolyte abnormalities including hyponatremia and hypokalemia. He was subsequently discharged home but came back 05/30/2014 for overdose on trazodone.  Assessment/Plan:    Ingestion of unknown drug / overdose  -Suspected overdose with trazodone tablets. Urine drug screen negative. Tylenol and salicylate levels not elevated. -Patient's mental status did not change over past 24 hours. -Patient is agreeable to go to assisted-living facility, he wants to go to [facility] in [place], [place].  Brain tumor  -Last MRI done 03/25/12 which showed: Areas of encephalomalacia related to previous surgery involving the left temporal lobe and the left petrous apex.  -Continue Keppra 1000 mg by mouth twice a day for seizure prophylaxis  Chronic pain  -Hold opiates until mental status improves.  Suspect Scabies  -Permethrin 5% ordered. Will need reapplication in one week.  Hypertension  -Initially, blood pressure medications were on hold. Blood pressure is 186/75 this morning. Added Norvasc 5 mg daily.  Type 2 diabetes mellitus  -A1c on this admission is 6.8 indicating good glycemic control. -Continue sliding scale insulin. -CBGs in past 24 hours: 101, 146  Hyponatremia  -May be from dehydration. Continue IV fluids but reduce the rate from 125 cc an hour down to 75 cc an hour. -Sodium is 135 this morning.  Hypokalemia  -Magnesium WNL. Potassium being repleted. Potassium 3.4 this morning.  History of substance abuse  -UDS and ethanol  levels negative. -Continue folic acid, thiamine.  Unspecified protein-calorie malnutrition  -Appreciate dietitian recommendations. Continue ensure supplementation.  Thrombocytosis  -Likely reactive or reflective of hemoconcentration. Monitor.   DVT prophylaxis  Subcutaneous heparin ordered.   Code Status: Full.  Family Communication: No family at the bedside this am.  Disposition Plan: Social worker to evaluate, medically ready for discharge.   IV Access:   Peripheral IV Procedures and diagnostic studies:   DG abdomen 05/29/2014 - no acute abnormalities Medical Consultants:   Dr. Ardath Sax, Psychiatry  Other Consultants:   Nutritionist  Anti-Infectives:   None    Birdie Hopes, MD  [facility] Pager [phone]  If 7PM-7AM, please contact night-coverage [email or web address] Password [email or web address] 06/01/2014, 1:18 PM   LOS: 2 days    HPI/Subjective: No acute overnight events.  Objective: Filed Vitals:   05/31/14 1152 05/31/14 1405 05/31/14 2041 06/01/14 0519  BP: 170/79 173/86 152/82 153/78  Pulse: 94 108 98 85  Temp: 99.1 F (37.3 C) 99.5 F (37.5 C)  98.4 F (36.9 C)  TempSrc: Axillary Oral  Oral  Resp: 16 18 18 18   Height:      Weight:      SpO2: 96% 95% 97% 93%    Intake/Output Summary (Last 24 hours) at 06/01/14 1318 Last data filed at 06/01/14 1000  Gross per 24 hour  Intake   1130 ml  Output   1150 ml  Net    -20 ml    Exam:   General:  Pt is alert, follows commands appropriately, not in acute distress  Cardiovascular: Regular rate and rhythm, S1/S2, no murmurs  Respiratory: Clear to auscultation bilaterally, no wheezing, no  crackles, no rhonchi  Abdomen: Soft, non tender, non distended, bowel sounds present  Extremities: No edema, pulses DP and PT palpable bilaterally  Skin: multiple scattered punctate lesions on LE seems to come from bruising   Neuro: Grossly nonfocal  Data Reviewed: Basic Metabolic Panel:  Recent  Labs Lab 05/29/14 2101 05/30/14 1324 05/30/14 1358 05/31/14 0345 06/01/14 0410  NA 127* 129*  --  135* 136*  K 2.8* 2.8*  --  3.4* 3.3*  CL 82* 86*  --  96 93*  CO2 31 28  --  27 28  GLUCOSE 157* 216*  --  124* 144*  BUN 15 13  --  8 4*  CREATININE 1.13 0.96  --  0.83 0.72  CALCIUM 8.6 9.1  --  8.9 9.2  MG  --   --  2.0  --   --    Liver Function Tests:  Recent Labs Lab 05/29/14 2101 05/30/14 1324  AST 19 19  ALT 9 8  ALKPHOS 184* 194*  BILITOT 0.3 0.3  PROT 7.1 7.1  ALBUMIN 2.9* 2.8*    Recent Labs Lab 05/29/14 2101  LIPASE 15   No results found for this basename: AMMONIA,  in the last 168 hours CBC:  Recent Labs Lab 05/29/14 2101 05/30/14 1324 05/31/14 0345 06/01/14 0410  WBC 10.4 9.6 9.0 12.3*  NEUTROABS 6.6  --   --   --   HGB 11.8* 11.8* 11.9* 12.7*  HCT 33.8* 34.8* 35.4* 37.6*  MCV 86.9 86.6 87.0 87.2  PLT 520* 497* 512* 555*   Cardiac Enzymes:  Recent Labs Lab 05/29/14 2101  TROPONINI <0.30   BNP: No components found with this basename: POCBNP,  CBG:  Recent Labs Lab 05/31/14 1148 05/31/14 1720 05/31/14 2123 06/01/14 0753 06/01/14 1130  GLUCAP 145* 130* 136* 170* 182*    MRSA PCR SCREENING     Status: None   Collection Time    05/30/14  4:39 PM      Result Value Ref Range Status   MRSA by PCR NEGATIVE  NEGATIVE Final     Studies: Dg Abd Acute W/chest 05/29/2014    IMPRESSION: No acute abnormality noted.   Electronically Signed   By: Inez Catalina M.D.   On: 05/29/2014 21:56    Scheduled Meds: . dorzolamide-timolol  1 drop Left Eye BID  . DULoxetine  60 mg Oral Daily  . folic acid  1 mg Intravenous Daily  . heparin  5,000 Units Subcutaneous 3 times per day  . insulin aspart  0-5 Units Subcutaneous QHS  . insulin aspart  0-9 Units Subcutaneous TID WC  . levETIRAcetam  1,000 mg Oral BID  . thiamine  100 mg Intravenous Daily   Continuous Infusions: . 0.9 % NaCl with KCl 40 mEq / L Stopped (05/31/14 2000)

## 2014-06-01 NOTE — Progress Notes (Signed)
Patient's peripheral IV came out, refused to have another IV access put in. Discussed with him the reason for the IV access but still refused to have one. On call NP Schorr was made aware.

## 2014-06-01 NOTE — Progress Notes (Signed)
Patient refused morning medications, patient educated on need for medications especially Keppra. Also educated on risk of not taking Keppra. Patient continues to refuse. Patient did agree to take cymbalta, see MAR.
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
## 2014-06-02 LAB — GLUCOSE, CAPILLARY
GLUCOSE-CAPILLARY: 161 mg/dL — AB (ref 70–99)
GLUCOSE-CAPILLARY: 154 mg/dL — AB (ref 70–99)
GLUCOSE-CAPILLARY: 159 mg/dL — AB (ref 70–99)
Glucose-Capillary: 168 mg/dL — ABNORMAL HIGH (ref 70–99)

## 2014-06-02 NOTE — Progress Notes (Signed)
Clinical Social Work  When [place] and psych MD met with patient at bedside who reports he would be interested in ALF at [facility] but would need to return home first. Patient agreeable for CSW to fax information to ALF for placement.  CSW spoke with Aram Beecham at [facility] (phone # [phone] fax # [phone]) who reports that she will review information and talk with directory. [facility] had additional questions re: finances and family support. Per cousin Ronalee Belts), he took patient to tour ALF about 2-3 years ago but patient enjoyed facility. Cousin reports patient makes around $26 a month with disability and pension. ALF requested that CSW speak with Department of Social Services (DSS) to determine if patient would be eligible for LT ALF placement. CSW spoke with Kathalene Frames at [place] who reports that she is familiar with patient and that he is over income requirement for Medicaid. CSW shared this information with ALF who confirmed they received referral but would have to take with administration to determine if patient could be accepted.   Cousin inquired about a direct transfer from hospital to ALF. CSW explained that patient wants to return home first and that patient cannot remain in the hospital if placement is only concern. Cousin reports understanding and that he could assist with transfer to ALF as well.  CSW met with patient at bedside and friend Milbert Coulter) was present. Patient agreeable for friend to be involved. CSW explained process with [facility] and suggested that patient allow CSW to expand the search to [facility] in [place] is unable to accept. Patient reports that he does not want to expand search and reports if [facility] does not accept then he will DC back home.   CSW will continue to follow.  [place], [place] [phone]

## 2014-06-02 NOTE — Progress Notes (Signed)
Clinical Social Work Department CLINICAL SOCIAL WORK PLACEMENT NOTE 06/02/2014  Patient:  GHASSAN, COGGESHALL  Account Number:  [id number] Admit date:  05/30/2014  Clinical Social Worker:  Sindy Messing, LCSW  Date/time:  06/02/2014 09:30 AM  Clinical Social Work is seeking post-discharge placement for this patient at the following level of care:   ASSISTED LIVING/REST HOME   (*CSW will update this form in Epic as items are completed)   06/02/2014  Patient/family provided with [place] Department of Clinical Social Work's list of facilities offering this level of care within the geographic area requested by the patient (or if unable, by the patient's family).  06/02/2014  Patient/family informed of their freedom to choose among providers that offer the needed level of care, that participate in Medicare, Medicaid or managed care program needed by the patient, have an available bed and are willing to accept the patient.  06/02/2014  Patient/family informed of [facility]' ownership interest in [facility], as well as of the fact that they are under no obligation to receive care at this facility.  PASARR submitted to EDS on 06/02/2014 PASARR number received on 06/02/2014  FL2 transmitted to all facilities in geographic area requested by pt/family on  06/02/2014 FL2 transmitted to all facilities within larger geographic area on   Patient informed that his/her managed care company has contracts with or will negotiate with  certain facilities, including the following:     Patient/family informed of bed offers received:   Patient chooses bed at  Physician recommends and patient chooses bed at    Patient to be transferred to  on   Patient to be transferred to facility by  Patient and family notified of transfer on  Name of family member notified:    The following physician request were entered in Epic:   Additional Comments:

## 2014-06-02 NOTE — Progress Notes (Signed)
Patient ID: JAKALEB PAYER, male   DOB: 1946-06-07, 68 y.o.   MRN: [id number] [facility] PROGRESS NOTE  DAMONTAY ALRED ASU:[id number] DOB: 07/17/1946 DOA: 05/30/2014 PCP: Lanette [place], MD  Brief narrative: 68 y.o. 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 Initial workup included abdominal x-ray which did not reveal acute abnormalities. Lab work showed multiple electrolyte abnormalities including hyponatremia and hypokalemia. He was subsequently discharged home but came back 05/30/2014 for overdose on trazodone.  Assessment/Plan:    Ingestion of unknown drug / overdose  -Suspected overdose with trazodone tablets. Urine drug screen negative. Tylenol and salicylate levels not elevated. -Patient's mental status did not change over past 24 hours. -Patient is agreeable to go to assisted-living facility, he wants to go to [facility] in [place], [place]. -Patient is ready for discharge, complex social situation, does not have help at home, wants to left. -Patient does not have Medicaid, his medicare does not pay for ALF. Social worker is looking for ALF in [facility] Brain tumor  -Last MRI done 03/25/12 which showed: Areas of encephalomalacia related to previous surgery involving the left temporal lobe and the left petrous apex.  -Continue Keppra 1000 mg by mouth twice a day for seizure prophylaxis  Chronic pain  -Hold opiates until mental status improves.  Suspect Scabies  -Permethrin 5% ordered. Will need reapplication in one week.  Hypertension  -Initially, blood pressure medications were on hold. Blood pressure is 186/75 this morning. Added Norvasc 5 mg daily.  Type 2 diabetes mellitus  -A1c on this admission is 6.8 indicating good glycemic control. -Continue sliding scale insulin. -CBGs in past 24 hours: 101, 146  Hyponatremia  -May be from dehydration. Continue IV  fluids but reduce the rate from 125 cc an hour down to 75 cc an hour. -Sodium is 135 this morning.  Hypokalemia  -Magnesium WNL. Potassium being repleted. Potassium 3.4 this morning.  History of substance abuse  -UDS and ethanol levels negative. -Continue folic acid, thiamine.  Unspecified protein-calorie malnutrition  -Appreciate dietitian recommendations. Continue ensure supplementation.  Thrombocytosis  -Likely reactive or reflective of hemoconcentration. Monitor.   DVT prophylaxis  Subcutaneous heparin ordered.   Code Status: Full.  Family Communication: No family at the bedside this am.  Disposition Plan: Social worker to evaluate, medically ready for discharge.   IV Access:   Peripheral IV Procedures and diagnostic studies:   DG abdomen 05/29/2014 - no acute abnormalities Medical Consultants:   Dr. Ardath Sax, Psychiatry  Other Consultants:   Nutritionist  Anti-Infectives:   None    Birdie Hopes, MD  [facility] Pager [phone]  If 7PM-7AM, please contact night-coverage [email or web address] Password [email or web address] 06/02/2014, 1:31 PM   LOS: 3 days    HPI/Subjective: No acute overnight events.  Objective: Filed Vitals:   06/01/14 0519 06/01/14 1411 06/01/14 2032 06/02/14 0522  BP: 153/78 153/91 137/87 136/75  Pulse: 85 112 105 116  Temp: 98.4 F (36.9 C) 98.3 F (36.8 C) 97.8 F (36.6 C) 98.1 F (36.7 C)  TempSrc: Oral Oral Oral Oral  Resp: 18 19 20 20   Height:      Weight:      SpO2: 93% 94% 98% 99%    Intake/Output Summary (Last 24 hours) at 06/02/14 1331 Last data filed at 06/01/14 1900  Gross per 24 hour  Intake    240 ml  Output  0 ml  Net    240 ml    Exam:   General:  Pt is alert, follows commands appropriately, not in acute distress  Cardiovascular: Regular rate and rhythm, S1/S2, no murmurs  Respiratory: Clear to auscultation bilaterally, no wheezing, no crackles, no rhonchi  Abdomen: Soft, non tender, non  distended, bowel sounds present  Extremities: No edema, pulses DP and PT palpable bilaterally  Skin: multiple scattered punctate lesions on LE seems to come from bruising   Neuro: Grossly nonfocal  Data Reviewed: Basic Metabolic Panel:  Recent Labs Lab 05/29/14 2101 05/30/14 1324 05/30/14 1358 05/31/14 0345 06/01/14 0410  NA 127* 129*  --  135* 136*  K 2.8* 2.8*  --  3.4* 3.3*  CL 82* 86*  --  96 93*  CO2 31 28  --  27 28  GLUCOSE 157* 216*  --  124* 144*  BUN 15 13  --  8 4*  CREATININE 1.13 0.96  --  0.83 0.72  CALCIUM 8.6 9.1  --  8.9 9.2  MG  --   --  2.0  --   --    Liver Function Tests:  Recent Labs Lab 05/29/14 2101 05/30/14 1324  AST 19 19  ALT 9 8  ALKPHOS 184* 194*  BILITOT 0.3 0.3  PROT 7.1 7.1  ALBUMIN 2.9* 2.8*    Recent Labs Lab 05/29/14 2101  LIPASE 15   No results found for this basename: AMMONIA,  in the last 168 hours CBC:  Recent Labs Lab 05/29/14 2101 05/30/14 1324 05/31/14 0345 06/01/14 0410  WBC 10.4 9.6 9.0 12.3*  NEUTROABS 6.6  --   --   --   HGB 11.8* 11.8* 11.9* 12.7*  HCT 33.8* 34.8* 35.4* 37.6*  MCV 86.9 86.6 87.0 87.2  PLT 520* 497* 512* 555*   Cardiac Enzymes:  Recent Labs Lab 05/29/14 2101  TROPONINI <0.30   BNP: No components found with this basename: POCBNP,  CBG:  Recent Labs Lab 06/01/14 1130 06/01/14 1653 06/01/14 2029 06/02/14 0749 06/02/14 1145  GLUCAP 182* 177* 158* 161* 168*    MRSA PCR SCREENING     Status: None   Collection Time    05/30/14  4:39 PM      Result Value Ref Range Status   MRSA by PCR NEGATIVE  NEGATIVE Final     Studies: Dg Abd Acute W/chest 05/29/2014    IMPRESSION: No acute abnormality noted.   Electronically Signed   By: Inez Catalina M.D.   On: 05/29/2014 21:56    Scheduled Meds: . dorzolamide-timolol  1 drop Left Eye BID  . DULoxetine  60 mg Oral Daily  . folic acid  1 mg Intravenous Daily  . heparin  5,000 Units Subcutaneous 3 times per day  . insulin aspart   0-5 Units Subcutaneous QHS  . insulin aspart  0-9 Units Subcutaneous TID WC  . levETIRAcetam  1,000 mg Oral BID  . thiamine  100 mg Intravenous Daily   Continuous Infusions: . 0.9 % NaCl with KCl 40 mEq / L Stopped (05/31/14 2000)

## 2014-06-02 NOTE — Progress Notes (Signed)
Received referral from inpatient LCSW for [place] [facility] services. However, patient does not have Holt MD. Therefore, [facility] Care Management unable to follow at this time. Marthenia Rolling, [facility] [phone]

## 2014-06-03 LAB — BASIC METABOLIC PANEL
Anion gap: 16 — ABNORMAL HIGH (ref 5–15)
BUN: 17 mg/dL (ref 6–23)
CALCIUM: 9.7 mg/dL (ref 8.4–10.5)
CO2: 25 mEq/L (ref 19–32)
Chloride: 95 mEq/L — ABNORMAL LOW (ref 96–112)
Creatinine, Ser: 0.78 mg/dL (ref 0.50–1.35)
GFR calc Af Amer: >90 mL/min (ref >90)
Glucose, Bld: 161 mg/dL — ABNORMAL HIGH (ref 70–99)
Potassium: 3.3 mEq/L — ABNORMAL LOW (ref 3.7–5.3)
Sodium: 136 mEq/L — ABNORMAL LOW (ref 137–147)

## 2014-06-03 LAB — GLUCOSE, CAPILLARY
GLUCOSE-CAPILLARY: 150 mg/dL — AB (ref 70–99)
Glucose-Capillary: 142 mg/dL — ABNORMAL HIGH (ref 70–99)

## 2014-06-03 MED ORDER — POTASSIUM CHLORIDE CRYS ER 20 MEQ PO TBCR
60.0000 meq | EXTENDED_RELEASE_TABLET | Freq: Once | ORAL | Status: AC
  Administered 2014-06-03: 60 meq via ORAL
  Filled 2014-06-03: qty 3

## 2014-06-03 MED ORDER — VITAMIN B-1 100 MG PO TABS
100.0000 mg | ORAL_TABLET | Freq: Every day | ORAL | Status: DC
  Administered 2014-06-03: 100 mg via ORAL
  Filled 2014-06-03: qty 1

## 2014-06-03 MED ORDER — FOLIC ACID 1 MG PO TABS
1.0000 mg | ORAL_TABLET | Freq: Every day | ORAL | Status: DC
  Administered 2014-06-03: 1 mg via ORAL
  Filled 2014-06-03: qty 1

## 2014-06-03 NOTE — Progress Notes (Signed)
This patient is receiving thiamine and folic acid IV. Based on criteria approved by the Pharmacy and Therapeutics Committee, this medication is being converted to the equivalent oral dose form. These criteria include:   . The patient is eating (either orally or per tube) and/or has been taking other orally administered medications for at least 24 hours.  . This patient has no evidence of active gastrointestinal bleeding or impaired GI absorption (gastrectomy, short bowel, patient on TNA or NPO).   If you have questions about this conversion, please contact the pharmacy department.  Kizzie Furnish, PharmD Pager: [phone] 06/03/2014 9:46 AM

## 2014-06-03 NOTE — Progress Notes (Signed)
Clinical Social Work  Patient plans to DC today. CSW met with patient at room to explain that [facility] is unsure if they can accept since patient is not eligible for Medicaid. CSW spoke with patient about selling personal property such as house or car in order to pay additional fees at ALF. Patient reports he will consider this option but is not eager to sell his house. Patient reports that CSW can discuss information with cousin as well. CSW called cousin while in the room with patient and explained barriers. Cousin reports he will assist as needed and glad that patient will receive [facility] services.   Patient reports he needs assistance with transportation. Cousin is unable to provide transportation but friend Milbert Coulter) reports he will come shortly to transport patient home. Patient reports he has keys and can get into home. Patient reports no further needs. CSW updated RN that friend can provide transportation.   CSW is signing off but available if needed.  [place],  [phone]

## 2014-06-03 NOTE — Care Management Note (Signed)
    Page 1 of 2   06/03/2014     2:13:20 PM CARE MANAGEMENT NOTE 06/03/2014  Patient:  Levi Morris, Levi Morris   Account Number:  [id number]  Date Initiated:  05/31/2014  Documentation initiated by:  DAVIS,RHONDA  Subjective/Objective Assessment:   Ingestion of unknown drug / overdose  Urine drug screen negative. Tylenol and salicylate levels not elevated.  Suspected trazodone overdose.  Admit for observation, recheck electrolytes in a.m.  Psychiatry consultation.      Active Problems     Action/Plan:   psych hospital versus home versus snf  pt has no local family and emergency contacts not answering   Anticipated DC Date:  06/03/2014   Anticipated DC Plan:  [place] referral  Clinical Social Worker      DC Forensic scientist  CM consult      [facility] Choice  HOME HEALTH   Choice offered to / List presented to:  C-1 Patient   DME arranged  NA      DME agency  NA     [place] arranged  HH-1 RN  [place].   Status of service:  Completed, signed off Medicare Important Message given?  NA - LOS <3 / Initial given by admissions (If response is "NO", the following Medicare IM given date fields will be blank) Date Medicare IM given:   Medicare IM given by:   Date Additional Medicare IM given:  06/02/2014 Additional Medicare IM given by:  Olga Coaster  Discharge Disposition:  [place]  Per UR Regulation:  Reviewed for med. necessity/level of care/duration of stay  If discussed at [place] of Stay Meetings, dates discussed:    Comments:  06-03-14 Sunday Spillers RN CM 1400 Spoke with patient at bedside regarding [place] needs. Insists on going home. Agreed to [facility] services, wants to use [facility]. Contacted Kristen with [facility], updated contact information in epic. Patients states [name] is a preferred contact for him.  06/02/2014- IM given to the patient, pt  legally blind, IM explained to the patient with understandingAneta Mins  [id number] AQTMA, RN, BSN, [place] [phone] Chart Reviewed for discharge and hospital needs. Discharge needs at time of review: None present will follow for needs. Review of patient progress due on [id number].

## 2014-06-03 NOTE — Progress Notes (Signed)
Discharge instruction verbally reviewed with patient.  Patient is able to verbally explain medications. Levi Morris A
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
## 2014-06-03 NOTE — Progress Notes (Signed)
Clinical Social Work  CSW left messages at [facility] for [facility] to determine if they can accept patient at [facility] continues to wait for a return call. CSW spoke with patient in order to discuss DC plans and to encourage patient to expand ALF search to [facility]. Patient reports he wants to go home at DC. Patient states that even if [facility] accepts him today that he plans to return home prior to going to any ALF. Patient reports he will not move to [facility] so it would be pointless to search for ALFs. Patient withdrawn during assessment and reports he does not want to talk but wants to go home. Patient reports he can follow up with [facility] at home if he decides to go there for placement.  CSW spoke with patient about his flat affect. Patient denies any depression but reports he is tired and wants to be left alone. Patient denies any SI or HI and contracts for safety.  [place], [place] [phone]

## 2014-06-09 ENCOUNTER — Encounter (HOSPITAL_COMMUNITY): Payer: Self-pay | Admitting: Emergency Medicine

## 2014-06-09 ENCOUNTER — Emergency Department (HOSPITAL_COMMUNITY)
Admission: EM | Admit: 2014-06-09 | Discharge: 2014-06-09 | Disposition: A | Discharge status: Home / Self Care | Payer: Medicare HMO | Attending: Emergency Medicine | Admitting: Emergency Medicine

## 2014-06-09 DIAGNOSIS — I10 Essential (primary) hypertension: Secondary | ICD-10-CM | POA: Insufficient documentation

## 2014-06-09 DIAGNOSIS — Z86011 Personal history of benign neoplasm of the brain: Secondary | ICD-10-CM | POA: Diagnosis not present

## 2014-06-09 DIAGNOSIS — Z8546 Personal history of malignant neoplasm of prostate: Secondary | ICD-10-CM | POA: Insufficient documentation

## 2014-06-09 DIAGNOSIS — IMO0001 Reserved for inherently not codable concepts without codable children: Secondary | ICD-10-CM | POA: Insufficient documentation

## 2014-06-09 DIAGNOSIS — E119 Type 2 diabetes mellitus without complications: Secondary | ICD-10-CM | POA: Diagnosis not present

## 2014-06-09 DIAGNOSIS — R51 Headache: Secondary | ICD-10-CM | POA: Insufficient documentation

## 2014-06-09 DIAGNOSIS — G8929 Other chronic pain: Secondary | ICD-10-CM | POA: Insufficient documentation

## 2014-06-09 DIAGNOSIS — H548 Legal blindness, as defined in USA: Secondary | ICD-10-CM | POA: Diagnosis not present

## 2014-06-09 DIAGNOSIS — Z79899 Other long term (current) drug therapy: Secondary | ICD-10-CM | POA: Diagnosis not present

## 2014-06-09 LAB — CBG MONITORING, ED: Glucose-Capillary: 171 mg/dL — ABNORMAL HIGH (ref 70–99)

## 2014-06-09 MED ORDER — HYDROMORPHONE HCL PF 1 MG/ML IJ SOLN
1.0000 mg | Freq: Once | INTRAMUSCULAR | Status: AC
  Administered 2014-06-09: 1 mg via INTRAMUSCULAR
  Filled 2014-06-09: qty 1

## 2014-06-09 MED ORDER — ONDANSETRON 8 MG PO TBDP
8.0000 mg | ORAL_TABLET | Freq: Once | ORAL | Status: AC
  Administered 2014-06-09: 8 mg via ORAL
  Filled 2014-06-09: qty 1

## 2014-06-09 NOTE — Discharge Instructions (Signed)

## 2014-06-09 NOTE — ED Provider Notes (Signed)
CSN: [id number]     Arrival date & time 06/09/14  1904 History   First MD Initiated Contact with Patient 06/09/14 1910     Chief Complaint  Patient presents with  . Headache     (Consider location/radiation/quality/duration/timing/severity/associated sxs/prior Treatment) HPI Comments: Patient presents today for evaluation of headache and generalized body pain. Patient reports that symptoms have been going on for 3 or 4 days. Patient reports pain essentially everywhere. Including in that if the headache. He reports that he has a history of a chronic headache, this is similar to headaches that he has had previously. He denies any falls or trauma.  Patient tells me that he does not have any pain medicine at home. He has a history of multiple chronic pain syndromes, tells me that he "forgot to ask his pain management doctor for a refill".  Patient is a 68 y.o. male presenting with headaches.  Headache Associated symptoms: myalgias     Past Medical History  Diagnosis Date  . Prostate tumor   . Brain tumor   . Chronic pain   . Hypertension   . Back ache   . Headache(784.0)     chronic  . Legally blind   . Diabetes mellitus    Past Surgical History  Procedure Laterality Date  . Brain surgery    . Eye surgery    . Tonsillectomy     No family history on file. History  Substance Use Topics  . Smoking status: Never Smoker   . Smokeless tobacco: Never Used  . Alcohol Use: Yes     Comment: occasional    Review of Systems  Musculoskeletal: Positive for myalgias.  Neurological: Positive for headaches.  All other systems reviewed and are negative.     Allergies  Poison oak extract  Home Medications   Prior to Admission medications   Medication Sig Start Date End Date Taking? Authorizing Provider  brimonidine (ALPHAGAN P) 0.1 % SOLN Place 1 drop into both eyes every 8 (eight) hours.    Historical Provider, MD  dorzolamide-timolol (COSOPT) 22.3-6.8 MG/ML ophthalmic  solution Place 1 drop into the left eye 2 (two) times daily.    Historical Provider, MD  DULoxetine (CYMBALTA) 60 MG capsule Take 60 mg by mouth daily.    Historical Provider, MD  levETIRAcetam (KEPPRA) 1000 MG tablet Take 1 tablet by mouth 2 (two) times daily. 05/02/14   Historical Provider, MD  LORazepam (ATIVAN) 1 MG tablet Take 1 mg by mouth every 4 (four) hours as needed for anxiety.    Historical Provider, MD  metFORMIN (GLUCOPHAGE) 1000 MG tablet Take 1 tablet (1,000 mg total) by mouth 2 (two) times daily. 03/27/14   Maudry Diego, MD  methocarbamol (ROBAXIN) 750 MG tablet Take 750-1,500 mg by mouth every 4 (four) hours as needed for muscle spasms. Max 5 tablets daily.    Historical Provider, MD  ondansetron (ZOFRAN) 4 MG tablet Take 4 mg by mouth every 8 (eight) hours as needed for nausea or vomiting.    Historical Provider, MD  oxyCODONE-acetaminophen (PERCOCET) 10-325 MG per tablet Take 1 tablet by mouth every 4 (four) hours.     Historical Provider, MD  pioglitazone (ACTOS) 30 MG tablet Take 30 mg by mouth daily.      Historical Provider, MD  pregabalin (LYRICA) 300 MG capsule Take 300-600 mg by mouth 2 (two) times daily. Takes 1 capsule in the morning and 2 capsules in the evening    Historical Provider, MD  triamterene-hydrochlorothiazide (  MAXZIDE-25) 37.5-25 MG per tablet Take 1 tablet by mouth daily.    Historical Provider, MD   BP 133/81  Pulse 107  Temp(Src) 99.1 F (37.3 C) (Oral)  Resp 18  Ht 6\' 3"  (1.905 m)  Wt 210 lb (95.255 kg)  BMI 26.25 kg/m2  SpO2 97% Physical Exam  Constitutional: He is oriented to person, place, and time. He appears well-developed and well-nourished. No distress.  HENT:  Head: Normocephalic and atraumatic.  Right Ear: Hearing normal.  Left Ear: Hearing normal.  Nose: Nose normal.  Mouth/Throat: Oropharynx is clear and moist and mucous membranes are normal.  Eyes:  S/p right enucleation   Neck: Normal range of motion. Neck supple.   Cardiovascular: Regular rhythm, S1 normal and S2 normal.  Exam reveals no gallop and no friction rub.   No murmur heard. Pulmonary/Chest: Effort normal and breath sounds normal. No respiratory distress. He exhibits no tenderness.  Abdominal: Soft. Normal appearance and bowel sounds are normal. There is no hepatosplenomegaly. There is no tenderness. There is no rebound, no guarding, no tenderness at McBurney's point and negative Murphy's sign. No hernia.  Musculoskeletal: Normal range of motion.  Neurological: He is alert and oriented to person, place, and time. He has normal strength. No cranial nerve deficit or sensory deficit. Coordination normal. GCS eye subscore is 4. GCS verbal subscore is 5. GCS motor subscore is 6.  Skin: Skin is warm, dry and intact. Rash (multiple scabs and superficial excoriations without erythema, drainage.) noted. No cyanosis.  Psychiatric: He has a normal mood and affect. His speech is normal and behavior is normal. Thought content normal.    ED Course  Procedures (including critical care time) Labs Review Labs Reviewed  CBG MONITORING, ED - Abnormal; Notable for the following:    Glucose-Capillary 171 (*)    All other components within normal limits    Imaging Review No results found.   EKG Interpretation None      MDM   Final diagnoses:  Chronic pain   Presents to ER with complaints of pain. Patient is having difficulty difference in where his pain is. He tells me his pain is all of his body. He does complain of headache, but has history of chronic headaches. This headache is no different today than it has been in the past. He has no focal findings on examination, neurologic exam unremarkable. Patient is off of his pain medication. He reports a history of oxycodone use, does not have any currently. He likely is experiencing some rebound secondary to not using his chronic narcotic analgesia. This explains the slight tachycardia present upon arrival as  well. Patient administered analgesia in the ER, will be discharged.  Of note, patient was found in a home that was described as filthy. There were bugs on him and he has multiple insect bites. None of them appear to be infected. Social services was contacted to followup and evaluate his home setting.    Orpah Greek, MD 06/09/14 2224

## 2014-06-09 NOTE — ED Notes (Signed)
This nurse called and spoke to tamara with DSS about pt's reported living conditions.  Per ems, pt's home is completely infested with roaches and other visible bugs in the home.   Pt is covered in what appear to be bug bites on arms and legs.  Pt tells this nurse that he is supposed to be going to live at [facility] facility, but he is not sure when.

## 2014-06-09 NOTE — ED Notes (Signed)
Pt here by ems with c/o headache.  Per ems, pt's home is horribly infested with roaches and other bugs.  Pt has multiple bug bites to arms and legs.

## 2014-06-12 ENCOUNTER — Encounter (HOSPITAL_COMMUNITY): Payer: Self-pay | Admitting: Emergency Medicine

## 2014-06-12 ENCOUNTER — Inpatient Hospital Stay (HOSPITAL_COMMUNITY)
Admission: EM | Admit: 2014-06-12 | Discharge: 2014-06-14 | DRG: 641 | Disposition: A | Discharge status: Skilled Nursing Facility | Payer: Medicare HMO | Attending: Family Medicine | Admitting: Family Medicine

## 2014-06-12 ENCOUNTER — Emergency Department (HOSPITAL_COMMUNITY): Payer: Medicare HMO

## 2014-06-12 DIAGNOSIS — G8929 Other chronic pain: Secondary | ICD-10-CM | POA: Diagnosis present

## 2014-06-12 DIAGNOSIS — Z7409 Other reduced mobility: Secondary | ICD-10-CM | POA: Diagnosis present

## 2014-06-12 DIAGNOSIS — R6510 Systemic inflammatory response syndrome (SIRS) of non-infectious origin without acute organ dysfunction: Secondary | ICD-10-CM

## 2014-06-12 DIAGNOSIS — H109 Unspecified conjunctivitis: Secondary | ICD-10-CM | POA: Diagnosis present

## 2014-06-12 DIAGNOSIS — M545 Low back pain, unspecified: Secondary | ICD-10-CM | POA: Diagnosis present

## 2014-06-12 DIAGNOSIS — D72829 Elevated white blood cell count, unspecified: Secondary | ICD-10-CM | POA: Diagnosis present

## 2014-06-12 DIAGNOSIS — H409 Unspecified glaucoma: Secondary | ICD-10-CM | POA: Diagnosis present

## 2014-06-12 DIAGNOSIS — R509 Fever, unspecified: Secondary | ICD-10-CM | POA: Diagnosis present

## 2014-06-12 DIAGNOSIS — Z9181 History of falling: Secondary | ICD-10-CM

## 2014-06-12 DIAGNOSIS — R627 Adult failure to thrive: Secondary | ICD-10-CM | POA: Diagnosis present

## 2014-06-12 DIAGNOSIS — Z79899 Other long term (current) drug therapy: Secondary | ICD-10-CM

## 2014-06-12 DIAGNOSIS — R460 Very low level of personal hygiene: Secondary | ICD-10-CM

## 2014-06-12 DIAGNOSIS — E871 Hypo-osmolality and hyponatremia: Principal | ICD-10-CM | POA: Diagnosis present

## 2014-06-12 DIAGNOSIS — E119 Type 2 diabetes mellitus without complications: Secondary | ICD-10-CM | POA: Diagnosis present

## 2014-06-12 DIAGNOSIS — H5440 Blindness, one eye, unspecified eye: Secondary | ICD-10-CM | POA: Diagnosis present

## 2014-06-12 DIAGNOSIS — E876 Hypokalemia: Secondary | ICD-10-CM | POA: Diagnosis present

## 2014-06-12 DIAGNOSIS — I10 Essential (primary) hypertension: Secondary | ICD-10-CM | POA: Diagnosis present

## 2014-06-12 DIAGNOSIS — H548 Legal blindness, as defined in USA: Secondary | ICD-10-CM | POA: Diagnosis present

## 2014-06-12 LAB — CBC WITH DIFFERENTIAL/PLATELET
Basophils Absolute: 0 10*3/uL (ref 0.0–0.1)
Basophils Relative: 0 % (ref 0–1)
Eosinophils Absolute: 0 10*3/uL (ref 0.0–0.7)
Eosinophils Relative: 0 % (ref 0–5)
HCT: 32.6 % — ABNORMAL LOW (ref 39.0–52.0)
Hemoglobin: 11.1 g/dL — ABNORMAL LOW (ref 13.0–17.0)
Lymphocytes Relative: 19 % (ref 12–46)
Lymphs Abs: 2.5 10*3/uL (ref 0.7–4.0)
MCH: 29.4 pg (ref 26.0–34.0)
MCHC: 34 g/dL (ref 30.0–36.0)
MCV: 86.2 fL (ref 78.0–100.0)
Monocytes Absolute: 1.1 10*3/uL — ABNORMAL HIGH (ref 0.1–1.0)
Monocytes Relative: 8 % (ref 3–12)
Neutro Abs: 9.9 10*3/uL — ABNORMAL HIGH (ref 1.7–7.7)
Neutrophils Relative %: 73 % (ref 43–77)
Platelets: 432 10*3/uL — ABNORMAL HIGH (ref 150–400)
RBC: 3.78 MIL/uL — ABNORMAL LOW (ref 4.22–5.81)
RDW: 13.6 % (ref 11.5–15.5)
WBC: 13.6 10*3/uL — ABNORMAL HIGH (ref 4.0–10.5)

## 2014-06-12 LAB — URINALYSIS, ROUTINE W REFLEX MICROSCOPIC
Glucose, UA: 500 mg/dL — AB
Hgb urine dipstick: NEGATIVE
Leukocytes, UA: NEGATIVE
Nitrite: NEGATIVE
Protein, ur: NEGATIVE mg/dL
Specific Gravity, Urine: 1.01 (ref 1.005–1.030)
Urobilinogen, UA: 4 mg/dL — ABNORMAL HIGH (ref 0.0–1.0)
pH: 6.5 (ref 5.0–8.0)

## 2014-06-12 LAB — BASIC METABOLIC PANEL
Anion gap: 14 (ref 5–15)
BUN: 10 mg/dL (ref 6–23)
CO2: 24 mEq/L (ref 19–32)
Calcium: 8.5 mg/dL (ref 8.4–10.5)
Chloride: 90 mEq/L — ABNORMAL LOW (ref 96–112)
Creatinine, Ser: 0.66 mg/dL (ref 0.50–1.35)
GFR calc Af Amer: >90 mL/min (ref >90)
GFR calc non Af Amer: >90 mL/min (ref >90)
Glucose, Bld: 165 mg/dL — ABNORMAL HIGH (ref 70–99)
Potassium: 3.4 mEq/L — ABNORMAL LOW (ref 3.7–5.3)
Sodium: 128 mEq/L — ABNORMAL LOW (ref 137–147)

## 2014-06-12 LAB — LACTIC ACID, PLASMA: Lactic Acid, Venous: 0.9 mmol/L (ref 0.5–2.2)

## 2014-06-12 LAB — MAGNESIUM: Magnesium: 1.8 mg/dL (ref 1.5–2.5)

## 2014-06-12 MED ORDER — SODIUM CHLORIDE 0.9 % IV BOLUS (SEPSIS)
1000.0000 mL | Freq: Once | INTRAVENOUS | Status: AC
  Administered 2014-06-12: 1000 mL via INTRAVENOUS

## 2014-06-12 MED ORDER — POTASSIUM CHLORIDE CRYS ER 20 MEQ PO TBCR
40.0000 meq | EXTENDED_RELEASE_TABLET | Freq: Once | ORAL | Status: AC
  Administered 2014-06-12: 40 meq via ORAL
  Filled 2014-06-12: qty 2

## 2014-06-12 MED ORDER — ACETAMINOPHEN 500 MG PO TABS
1000.0000 mg | ORAL_TABLET | Freq: Once | ORAL | Status: AC
  Administered 2014-06-12: 1000 mg via ORAL
  Filled 2014-06-12: qty 2

## 2014-06-12 NOTE — ED Notes (Signed)
MD at bedside. 

## 2014-06-12 NOTE — ED Notes (Signed)
Per EMS: pt has made several calls to EMS to "turn my air conditioner on and fill my cup with ice". [employer] has been on scene and reportedly told pt if he misused the EMS service again he would arrest. Pt called EMS again for back pain. Told EMS his lower back was hurting but "now it's all over".

## 2014-06-12 NOTE — ED Notes (Signed)
Lab at bedside

## 2014-06-12 NOTE — ED Provider Notes (Signed)
CSN: [id number]     Arrival date & time 06/12/14  1902 History   First MD Initiated Contact with Patient 06/12/14 1919     Chief Complaint  Patient presents with  . Back Pain     (Consider location/radiation/quality/duration/timing/severity/associated sxs/prior Treatment) HPI  68 year old male brought in by EMS. Apparently EMS was called several times by patient today. On EMS arrival patient with inappropriate requests such as asking to turn on his air conditioner or to be given a cup of ice. Police responded because of inappropriate use of EMS service and given ultimatum between either being charged or being taken to ED.  He elected to come to ED. He is complaining of "I hurt everywhere." Patient has a history of chronic pain. He does report a recent fall onto his butt and has had increased lower back pain which is different than his chronic pain. He cannot give me clear history as to exactly why he fell. He did not strike his head. No loss of consciousness. 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 Noted to be tachycardic on my exam and oral temperature 99.6. Rectal temperature was checked and 101.6. He denies any subjective fever. No respiratory complaints. No urinary complaints. No nausea or vomiting. He is generally a very poor historian.   Past Medical History  Diagnosis Date  . Prostate tumor   . Brain tumor   . Chronic pain   . Hypertension   . Back ache   . Headache(784.0)     chronic  . Legally blind   . Diabetes mellitus    Past Surgical History  Procedure Laterality Date  . Brain surgery    . Eye surgery    . Tonsillectomy     No family history on file. History  Substance Use Topics  . Smoking status: Never Smoker   . Smokeless tobacco: Never Used  . Alcohol Use: Yes     Comment: occasional    Review of Systems  All systems reviewed and negative, other than as noted in HPI.   Allergies  Poison oak extract  Home Medications   Prior to Admission  medications   Medication Sig Start Date End Date Taking? Authorizing Provider  brimonidine (ALPHAGAN P) 0.1 % SOLN Place 1 drop into both eyes every 8 (eight) hours.    Historical Provider, MD  dorzolamide-timolol (COSOPT) 22.3-6.8 MG/ML ophthalmic solution Place 1 drop into the left eye 2 (two) times daily.    Historical Provider, MD  DULoxetine (CYMBALTA) 60 MG capsule Take 60 mg by mouth daily.    Historical Provider, MD  levETIRAcetam (KEPPRA) 1000 MG tablet Take 1 tablet by mouth 2 (two) times daily. 05/02/14   Historical Provider, MD  LORazepam (ATIVAN) 1 MG tablet Take 1 mg by mouth every 4 (four) hours as needed for anxiety.    Historical Provider, MD  metFORMIN (GLUCOPHAGE) 1000 MG tablet Take 1 tablet (1,000 mg total) by mouth 2 (two) times daily. 03/27/14   Maudry Diego, MD  methocarbamol (ROBAXIN) 750 MG tablet Take 750-1,500 mg by mouth every 4 (four) hours as needed for muscle spasms. Max 5 tablets daily.    Historical Provider, MD  ondansetron (ZOFRAN) 4 MG tablet Take 4 mg by mouth every 8 (eight) hours as needed for nausea or vomiting.    Historical Provider, MD  oxyCODONE-acetaminophen (PERCOCET) 10-325 MG per tablet Take 1 tablet by mouth every 4 (four) hours.     Historical Provider, MD  pioglitazone (ACTOS) 30 MG tablet  Take 30 mg by mouth daily.      Historical Provider, MD  pregabalin (LYRICA) 300 MG capsule Take 300-600 mg by mouth 2 (two) times daily. Takes 1 capsule in the morning and 2 capsules in the evening    Historical Provider, MD  triamterene-hydrochlorothiazide (MAXZIDE-25) 37.5-25 MG per tablet Take 1 tablet by mouth daily.    Historical Provider, MD   BP 131/73  Pulse 106  Temp(Src) 101.6 F (38.7 C) (Rectal)  Resp 20  SpO2 97% Physical Exam  Nursing note and vitals reviewed. Constitutional:  Laying in bed. NAD. Disheveled appearance. Dried food on clothes. Smells strongly of urine    HENT:  Head: Normocephalic and atraumatic.  Eyes: Right eye exhibits  no discharge. Left eye exhibits no discharge.  Enucleated R eye  Neck: Neck supple.  Cardiovascular: Regular rhythm and normal heart sounds.  Exam reveals no gallop and no friction rub.   No murmur heard. Mild tachcyardia  Pulmonary/Chest: Effort normal and breath sounds normal. No respiratory distress.  Abdominal: Soft. He exhibits no distension. There is no tenderness.  Musculoskeletal:  No bony tenderness of extremities. Reports increased lower back pain with ROM of either hip, L worse than R. Denies hip/groin pain with ROM either hip. No cervical tenderness. TTP lumbar region. No stepoff or deformity  Neurological: He is alert. No cranial nerve deficit. He exhibits normal muscle tone.  Skin:  Innumerable superficial abrasions/exocoriations. None appear secondarily infected.   Psychiatric: His behavior is normal. Thought content normal.    ED Course  Procedures (including critical care time) Labs Review Labs Reviewed  URINALYSIS, ROUTINE W REFLEX MICROSCOPIC - Abnormal; Notable for the following:    Color, Urine AMBER (*)    Glucose, UA 500 (*)    Bilirubin Urine SMALL (*)    Ketones, ur TRACE (*)    Urobilinogen, UA 4.0 (*)    All other components within normal limits  CBC WITH DIFFERENTIAL - Abnormal; Notable for the following:    WBC 13.6 (*)    RBC 3.78 (*)    Hemoglobin 11.1 (*)    HCT 32.6 (*)    Platelets 432 (*)    Neutro Abs 9.9 (*)    Monocytes Absolute 1.1 (*)    All other components within normal limits  BASIC METABOLIC PANEL - Abnormal; Notable for the following:    Sodium 128 (*)    Potassium 3.4 (*)    Chloride 90 (*)    Glucose, Bld 165 (*)    All other components within normal limits  URINE CULTURE  CULTURE, BLOOD (ROUTINE X 2)  CULTURE, BLOOD (ROUTINE X 2)  LACTIC ACID, PLASMA  URINE MICROSCOPIC-ADD ON  MAGNESIUM    Imaging Review Dg Chest 1 View  06/12/2014   CLINICAL DATA:  Back pain.  Weakness.  EXAM: CHEST - 1 VIEW  COMPARISON:  05/29/2014   FINDINGS: Heart size and pulmonary vascularity are normal and the lungs are clear. No effusions. Slight thoracic scoliosis. No acute osseous abnormality.  IMPRESSION: No acute disease.   Electronically Signed   By: Rozetta Nunnery M.D.   On: 06/12/2014 20:46   Dg Lumbar Spine Complete  06/12/2014   CLINICAL DATA:  Back pain.  EXAM: LUMBAR SPINE - COMPLETE 4+ VIEW  COMPARISON:  Radiographs dated 09/08/2013 and 12/11/2009  FINDINGS: There is slight narrowing of the L5-S1 disc space, chronic. The other disc spaces are well maintained. Small anterior osteophytes at L2-3.  There is no fracture or bone destruction.  No facet arthritis.  IMPRESSION: No acute abnormality. Chronic degenerative disc disease at L5-S1 and to a minimal degree at L2-3   Electronically Signed   By: Rozetta Nunnery M.D.   On: 06/12/2014 20:45     EKG Interpretation   Date/Time:  Sunday June 12 2014 20:28:31 EDT Ventricular Rate:  109 PR Interval:  138 QRS Duration: 89 QT Interval:  324 QTC Calculation: 436 R Axis:   22 Text Interpretation:  Sinus tachycardia Abnormal R-wave progression, early  transition ED PHYSICIAN INTERPRETATION AVAILABLE IN [facility]  Confirmed by TEST, Record ([id number]) on 06/14/2014 7:25:05 AM      MDM   Final diagnoses:  Systemic inflammatory response syndrome (SIRS)  Hyponatremia  Poor personal hygiene    68yM with "pain all over." Noted to be febrile in ED. Leukocytosis. No clear source. UA fine. CXR clear. Numerous excoriations but no cellulitis. Urine/blood cultures sent. Abx deferred at this time. BP fine. Hyponatremic. On maxzide. Noted on recent admit and resolved by DC. I do not feel pt can appropriately care for himself. Addressed ALF during previous admission, but pt has not seemed to make any serious attempts to pursue it fruther once discharged.    Virgel Manifold, MD 06/15/14 [id number]

## 2014-06-12 NOTE — ED Notes (Signed)
Hospitalist at bedside 

## 2014-06-12 NOTE — H&P (Signed)
History and Physical  Levi Morris YTK:[id number] DOB: 05-23-1946 DOA: 06/12/2014  Referring physician: Emergency department PCP: Lanette [place], MD   Chief Complaint: Fever  HPI: Levi Morris is a 68 y.o. male  With a history of type 2 diabetes, hypertension, chronic pain, glaucoma, recent hospitalization. The patient was recently hospitalized from 05/30/2014 to 06/03/2014 for indigestion and attempted overdose. Patient was found to have hyponatremia at that time. Patient is well-known to have difficulty taking care of himself and was evaluated for placement. However, according to prior notes, patient's insisted upon returning home. The patient's notes fever over the past couple of days he has not been taking some of his medications as he has run out. He notes no other symptoms, other than diaphoresis. He believes that he is a fever due to his air conditioning not working properly. He was brought to the hospital by EMS, which he called several times throughout the course of the day to have them assist him by giving him icewater, turning on air conditioning, etc. when he was brought in to the hospital, strongly of urine.  Review of Systems:  Pt complains of generalized pain, hip pain, left leg pain.  Pt denies any nausea, vomiting, cough, shortness of breath, chest pain, congestion, headache, diarrhea.  Review of systems are otherwise negative  Past Medical History  Diagnosis Date  . Prostate tumor   . Brain tumor   . Chronic pain   . Hypertension   . Back ache   . Headache(784.0)     chronic  . Legally blind   . Diabetes mellitus    Past Surgical History  Procedure Laterality Date  . Brain surgery    . Eye surgery    . Tonsillectomy     Social History:  reports that he has never smoked. He has never used smokeless tobacco. He reports that he drinks alcohol. He reports that he does not use illicit drugs. Patient lives at home & is unable to take care of himself   Allergies    Allergen Reactions  . Poison Oak Extract [Extract Of Poison Oak] Rash    History reviewed. No pertinent family history.    Prior to Admission medications   Medication Sig Start Date End Date Taking? Authorizing Provider  brimonidine (ALPHAGAN P) 0.1 % SOLN Place 1 drop into both eyes every 8 (eight) hours.    Historical Provider, MD  dorzolamide-timolol (COSOPT) 22.3-6.8 MG/ML ophthalmic solution Place 1 drop into the left eye 2 (two) times daily.    Historical Provider, MD  DULoxetine (CYMBALTA) 60 MG capsule Take 60 mg by mouth daily.    Historical Provider, MD  levETIRAcetam (KEPPRA) 1000 MG tablet Take 1 tablet by mouth 2 (two) times daily. 05/02/14   Historical Provider, MD  LORazepam (ATIVAN) 1 MG tablet Take 1 mg by mouth every 4 (four) hours as needed for anxiety.    Historical Provider, MD  metFORMIN (GLUCOPHAGE) 1000 MG tablet Take 1 tablet (1,000 mg total) by mouth 2 (two) times daily. 03/27/14   Maudry Diego, MD  methocarbamol (ROBAXIN) 750 MG tablet Take 750-1,500 mg by mouth every 4 (four) hours as needed for muscle spasms. Max 5 tablets daily.    Historical Provider, MD  ondansetron (ZOFRAN) 4 MG tablet Take 4 mg by mouth every 8 (eight) hours as needed for nausea or vomiting.    Historical Provider, MD  oxyCODONE-acetaminophen (PERCOCET) 10-325 MG per tablet Take 1 tablet by mouth every 4 (four) hours.  Historical Provider, MD  pioglitazone (ACTOS) 30 MG tablet Take 30 mg by mouth daily.      Historical Provider, MD  pregabalin (LYRICA) 300 MG capsule Take 300-600 mg by mouth 2 (two) times daily. Takes 1 capsule in the morning and 2 capsules in the evening    Historical Provider, MD  triamterene-hydrochlorothiazide (MAXZIDE-25) 37.5-25 MG per tablet Take 1 tablet by mouth daily.    Historical Provider, MD    Physical Exam: BP 159/82  Pulse 106  Temp(Src) 99.2 F (37.3 C) (Rectal)  Resp 21  SpO2 99%  General:  In general this is an elderly Caucasian male who is  awake and oriented x3. He is not in any acute cardiopulmonary distress. Eyes: Right eye absent, left eye is injected with mucopurulent discharge. ENT: Auditory canals are patent tympanic membranes reflective to light. Teeth in poor repair. No mucosal lesions. Neck: Supple and without lymphadenopathy. No jugular venous tension Cardiovascular: Regular rate with normal S1-S2 sounds. No murmurs auscultated. Respiratory: Clear to auscultation bilaterally with good respiratory effort. No wheezes rales or rhonchi Abdomen: Soft, nontender, nondistended, and without guarding. No hepatosplenomegaly or masses palpated. Skin: Warm to touch. Back sweating. No rashes noted. Multiple healing wounds on his legs bilaterally, presumably from insect bites treated during last hospitalization Musculoskeletal: Diffuse tenderness. No erythematous joints Psychiatric: Adequate insight and judgment Neurologic: No focal neurological deficit observed. Cranial nerves II through XII are grossly intact           Labs on Admission:  Basic Metabolic Panel:  Recent Labs Lab 06/12/14 2050  NA 128*  K 3.4*  CL 90*  CO2 24  GLUCOSE 165*  BUN 10  CREATININE 0.66  CALCIUM 8.5  MG 1.8   Liver Function Tests: No results found for this basename: AST, ALT, ALKPHOS, BILITOT, PROT, ALBUMIN,  in the last 168 hours No results found for this basename: LIPASE, AMYLASE,  in the last 168 hours No results found for this basename: AMMONIA,  in the last 168 hours CBC:  Recent Labs Lab 06/12/14 2050  WBC 13.6*  NEUTROABS 9.9*  HGB 11.1*  HCT 32.6*  MCV 86.2  PLT 432*   Cardiac Enzymes: No results found for this basename: CKTOTAL, CKMB, CKMBINDEX, TROPONINI,  in the last 168 hours  BNP (last 3 results) No results found for this basename: PROBNP,  in the last 8760 hours CBG:  Recent Labs Lab 06/09/14 2214  GLUCAP 171*    Radiological Exams on Admission: Dg Chest 1 View  06/12/2014   CLINICAL DATA:  Back pain.   Weakness.  EXAM: CHEST - 1 VIEW  COMPARISON:  05/29/2014  FINDINGS: Heart size and pulmonary vascularity are normal and the lungs are clear. No effusions. Slight thoracic scoliosis. No acute osseous abnormality.  IMPRESSION: No acute disease.   Electronically Signed   By: Rozetta Nunnery M.D.   On: 06/12/2014 20:46   Dg Lumbar Spine Complete  06/12/2014   CLINICAL DATA:  Back pain.  EXAM: LUMBAR SPINE - COMPLETE 4+ VIEW  COMPARISON:  Radiographs dated 09/08/2013 and 12/11/2009  FINDINGS: There is slight narrowing of the L5-S1 disc space, chronic. The other disc spaces are well maintained. Small anterior osteophytes at L2-3.  There is no fracture or bone destruction.  No facet arthritis.  IMPRESSION: No acute abnormality. Chronic degenerative disc disease at L5-S1 and to a minimal degree at L2-3   Electronically Signed   By: Rozetta Nunnery M.D.   On: 06/12/2014 20:45  EKG: Independently reviewed.  Assessment/Plan Present on Admission:  . Hyponatremia . Hypokalemia . Conjunctivitis . Fever . Impaired mobility and ADLs  #1 fever Admit, blood cultures and urine cultures done. Antipyretics Hold on antibiotics as no source currently hemodynamically stable We'll treat with broad-spectrum antibiotics due to recent hospitalization should the patient's temperature spike  #2 hyponatremia May be from diuretics We'll hold diuretics and treat with IV fluids normal saline Recheck BMP in the morning  #3 hypokalemia Replaced in the ER. Will add potassium to IV fluids for adequate replacement  #4 Conjunctivitis Treatment Polytrim  #5 failure to thrive with inability to take care of himself Social work consult  #6 diabetes type 2 Continue Actos and metformin  #7 hypertension Replacement side with lisinopril  Consultants: None  Code Status: Full  Family Communication: None   Disposition Plan: I would recommend placement following stabilization of medical conditions  Time spent: 70  minutes  Loma Boston, DO [facility] Pager [phone]  **Disclaimer: This note may have been dictated with voice recognition software. Similar sounding words can inadvertently be transcribed and this note may contain transcription errors which may not have been corrected upon publication of note.**

## 2014-06-12 NOTE — ED Notes (Signed)
Patient given Diet Coke .

## 2014-06-13 ENCOUNTER — Encounter (HOSPITAL_COMMUNITY): Payer: Self-pay | Admitting: *Deleted

## 2014-06-13 LAB — CBC
HEMATOCRIT: 38.1 % — AB (ref 39.0–52.0)
Hemoglobin: 12.7 g/dL — ABNORMAL LOW (ref 13.0–17.0)
MCH: 29.5 pg (ref 26.0–34.0)
MCHC: 33.3 g/dL (ref 30.0–36.0)
MCV: 88.6 fL (ref 78.0–100.0)
Platelets: 450 10*3/uL — ABNORMAL HIGH (ref 150–400)
RBC: 4.3 MIL/uL (ref 4.22–5.81)
RDW: 13.8 % (ref 11.5–15.5)
WBC: 11.9 10*3/uL — ABNORMAL HIGH (ref 4.0–10.5)

## 2014-06-13 LAB — BASIC METABOLIC PANEL
Anion gap: 15 (ref 5–15)
BUN: 7 mg/dL (ref 6–23)
CALCIUM: 9.3 mg/dL (ref 8.4–10.5)
CO2: 26 mEq/L (ref 19–32)
Chloride: 99 mEq/L (ref 96–112)
Creatinine, Ser: 0.69 mg/dL (ref 0.50–1.35)
GFR calc Af Amer: >90 mL/min (ref >90)
GLUCOSE: 141 mg/dL — AB (ref 70–99)
Potassium: 3.9 mEq/L (ref 3.7–5.3)
SODIUM: 140 meq/L (ref 137–147)

## 2014-06-13 MED ORDER — LORAZEPAM 1 MG PO TABS
1.0000 mg | ORAL_TABLET | ORAL | Status: DC | PRN
  Administered 2014-06-13 – 2014-06-14 (×2): 1 mg via ORAL
  Filled 2014-06-13 (×2): qty 1

## 2014-06-13 MED ORDER — OXYCODONE-ACETAMINOPHEN 5-325 MG PO TABS
2.0000 | ORAL_TABLET | ORAL | Status: DC | PRN
  Administered 2014-06-13 – 2014-06-14 (×3): 2 via ORAL
  Filled 2014-06-13 (×3): qty 2

## 2014-06-13 MED ORDER — POTASSIUM CHLORIDE IN NACL 40-0.9 MEQ/L-% IV SOLN
INTRAVENOUS | Status: DC
  Administered 2014-06-13 (×3): 125 mL/h via INTRAVENOUS

## 2014-06-13 MED ORDER — ZOLPIDEM TARTRATE 5 MG PO TABS: 5.0000 mg | ORAL_TABLET | Freq: Every evening | ORAL | Status: DC | PRN

## 2014-06-13 MED ORDER — PIOGLITAZONE HCL 15 MG PO TABS
30.0000 mg | ORAL_TABLET | Freq: Every day | ORAL | Status: DC
  Administered 2014-06-13 – 2014-06-14 (×2): 30 mg via ORAL
  Filled 2014-06-13 (×2): qty 2
  Filled 2014-06-13: qty 1

## 2014-06-13 MED ORDER — BRIMONIDINE TARTRATE 0.15 % OP SOLN
1.0000 [drp] | Freq: Three times a day (TID) | OPHTHALMIC | Status: DC
  Administered 2014-06-13 – 2014-06-14 (×4): 1 [drp] via OPHTHALMIC
  Filled 2014-06-13: qty 5

## 2014-06-13 MED ORDER — DULOXETINE HCL 60 MG PO CPEP
60.0000 mg | ORAL_CAPSULE | Freq: Every day | ORAL | Status: DC
  Administered 2014-06-13 – 2014-06-14 (×2): 60 mg via ORAL
  Filled 2014-06-13 (×2): qty 1

## 2014-06-13 MED ORDER — PREGABALIN 75 MG PO CAPS
300.0000 mg | ORAL_CAPSULE | Freq: Every day | ORAL | Status: DC
  Administered 2014-06-14: 300 mg via ORAL
  Filled 2014-06-13 (×2): qty 4

## 2014-06-13 MED ORDER — METHOCARBAMOL 500 MG PO TABS: 750.0000 mg | ORAL_TABLET | ORAL | Status: DC | PRN

## 2014-06-13 MED ORDER — ALUM & MAG HYDROXIDE-SIMETH 200-200-20 MG/5ML PO SUSP: 30.0000 mL | Freq: Four times a day (QID) | ORAL | Status: DC | PRN

## 2014-06-13 MED ORDER — PREGABALIN 75 MG PO CAPS: 300.0000 mg | ORAL_CAPSULE | Freq: Every day | ORAL | Status: DC

## 2014-06-13 MED ORDER — PREGABALIN 75 MG PO CAPS
600.0000 mg | ORAL_CAPSULE | Freq: Every day | ORAL | Status: DC
  Administered 2014-06-13 (×2): 600 mg via ORAL
  Filled 2014-06-13 (×2): qty 8

## 2014-06-13 MED ORDER — LISINOPRIL 10 MG PO TABS
40.0000 mg | ORAL_TABLET | Freq: Every day | ORAL | Status: DC
  Administered 2014-06-13 – 2014-06-14 (×2): 40 mg via ORAL
  Filled 2014-06-13 (×2): qty 4

## 2014-06-13 MED ORDER — DOCUSATE SODIUM 100 MG PO CAPS: 100.0000 mg | ORAL_CAPSULE | Freq: Every day | ORAL | Status: DC | PRN

## 2014-06-13 MED ORDER — ACETAMINOPHEN 325 MG PO TABS: 650.0000 mg | ORAL_TABLET | Freq: Four times a day (QID) | ORAL | Status: DC | PRN

## 2014-06-13 MED ORDER — ACETAMINOPHEN 650 MG RE SUPP
650.0000 mg | Freq: Four times a day (QID) | RECTAL | Status: DC | PRN
Start: 2014-06-13 — End: 2014-06-14

## 2014-06-13 MED ORDER — PREGABALIN 75 MG PO CAPS: 300.0000 mg | ORAL_CAPSULE | Freq: Two times a day (BID) | ORAL | Status: DC

## 2014-06-13 MED ORDER — ENOXAPARIN SODIUM 40 MG/0.4ML ~~LOC~~ SOLN
40.0000 mg | SUBCUTANEOUS | Status: DC
  Administered 2014-06-13: 40 mg via SUBCUTANEOUS
  Filled 2014-06-13 (×2): qty 0.4

## 2014-06-13 MED ORDER — ONDANSETRON HCL 4 MG PO TABS: 4.0000 mg | ORAL_TABLET | Freq: Three times a day (TID) | ORAL | Status: DC | PRN

## 2014-06-13 MED ORDER — POLYMYXIN B-TRIMETHOPRIM 10000-0.1 UNIT/ML-% OP SOLN
1.0000 [drp] | OPHTHALMIC | Status: DC
  Administered 2014-06-13 – 2014-06-14 (×7): 1 [drp] via OPHTHALMIC
  Filled 2014-06-13: qty 10

## 2014-06-13 MED ORDER — METFORMIN HCL 500 MG PO TABS
1000.0000 mg | ORAL_TABLET | Freq: Two times a day (BID) | ORAL | Status: DC
  Administered 2014-06-13 – 2014-06-14 (×3): 1000 mg via ORAL
  Filled 2014-06-13 (×3): qty 2

## 2014-06-13 MED ORDER — LEVETIRACETAM 500 MG PO TABS
1000.0000 mg | ORAL_TABLET | Freq: Two times a day (BID) | ORAL | Status: DC
  Administered 2014-06-13 – 2014-06-14 (×4): 1000 mg via ORAL
  Filled 2014-06-13 (×4): qty 2

## 2014-06-13 NOTE — Clinical Social Work Psychosocial (Signed)
Clinical Social Work Department BRIEF PSYCHOSOCIAL ASSESSMENT 06/13/2014  Patient:  Levi Morris, Levi Morris     Account Number:  [id number]     Admit date:  06/12/2014  Clinical Social Worker:  Wyatt Haste  Date/Time:  06/13/2014 03:19 PM  Referred by:  CSW  Date Referred:  06/13/2014 Referred for  Psychosocial assessment   Other Referral:   Interview type:  Patient Other interview type:   friend: Chrissie Noa in room for part of assessment    PSYCHOSOCIAL DATA Living Status:  ALONE Admitted from facility:   Level of care:   Primary support name:  Chrissie Noa Primary support relationship to patient:  FRIEND Degree of support available:   limited    CURRENT CONCERNS Current Concerns  Post-Acute Placement   Other Concerns:    SOCIAL WORK ASSESSMENT / PLAN CSW met with pt at bedside after visit from [place] [facility] this morning. Threasa Beards reported that pt was only able to tell her that he was in [facility], but otherwise did not answer any questions correctly on mini mental. She was planning to file for emergency guardianship if needed. CSW met with pt this afternoon. He was sitting in bed feeding himself. Pt is legally blind. He was able to provide all necessary information. States he hurt his back in 1983 while working at [employer] and has suffered from chronic pain since then.  He lives alone in a house left to him by his parents. Pt admits that it is not in good shape and said he has roaches. He also admits that he does not take his medication properly. He is able to fix canned or microwaveable food. Pt states he does okay at home on his own. His best support, is a friend, Chrissie Noa who has looked after pt for about 6 years. Chrissie Noa said he generally sees pt daily. Pt reports he fell at home and called EMS. CSW questioned pt about multiple EMS calls for things like turning air conditioning on, getting him water, etc. Pt states that he calls for medical reasons and then while they are there asks  them to do these things. He indicates he was not aware that was not what EMS was for. 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 He has been seeing a specialist and said he has an appointment with him on August 24.   CSW asked pt about d/c plan. Pt would like to return home if able. Awaiting PT evaluation. Pt indicates if SNF was recommended he would consider it. CSW left voicemail for [facility] requesting return call to discuss improvement in mental status. Will attempt again in AM.   Assessment/plan status:  Psychosocial Support/Ongoing Assessment of Needs Other assessment/ plan:   Information/referral to community resources:   will assess    PATIENT'S/FAMILY'S RESPONSE TO PLAN OF CARE: CSW will follow up with pt after PT evaluation to discuss d/c planning further.       Benay Pike, [place]

## 2014-06-13 NOTE — Evaluation (Signed)
Physical Therapy Evaluation Patient Details Name: Levi Morris MRN: [id number] DOB: [birth year] Today's Date: 06/13/2014   History of Present Illness  Pt is a 68 yo male With a history of type 2 diabetes, hypertension, chronic pain, glaucoma, recent hospitalization. The patient was recently hospitalized from 05/30/2014 to 06/03/2014 for indigestion and attempted overdose. Patient was found to have hyponatremia at that time. Patient is well-known to have difficulty taking care of himself and was evaluated for placement. However, according to prior notes, patient's insisted upon returning home. The patient's notes fever over the past couple of days he has not been taking some of his medications as he has run out. He notes no other symptoms, other than diaphoresis. He believes that he is a fever due to his air conditioning not working properly. He was brought to the hospital by EMS, which he called several times throughout the course of the day to have them assist him by giving him icewater, turning on air conditioning, etc. when he was brought in to the hospital, strongly of urine.  Clinical Impression  Pt is a 68 year old male who presents to physical therapy with dx of hyponatremia.  Pt is legally blind.  Pt lives alone, though has a friends who assists PRN.  Pt reports he was previously able to complete functional mobility skills around the home without assistance, though spent most of his time in his broken lift chair.  During evaluation, pt required min/mod assist for bed mobility skills, and min assist and use of RW for transfer to stand. Pt unable to amb at this time secondary to inability to clear Rt foot from as pt had difficulty WB on the Lt LE "my bad leg".  Pt was able to side step 2 feet to the Rt to the [facility].  Recommend continued PT to address strengthening, activity tolerance, balance for improved functional mobility skills with transition to SNF at discharge.  Recommend use of RW for gait, will  defer to SNF for DME requirements.     Follow Up Recommendations SNF    Equipment Recommendations  Other (comment) (Will defer to SNF, recommend use of RW for gait)       Precautions / Restrictions Precautions Precautions: Fall Precaution Comments: Legally Blind Restrictions Weight Bearing Restrictions: No      Mobility  Bed Mobility Overal bed mobility: Needs Assistance Bed Mobility: Supine to Sit;Sit to Supine     Supine to sit: Mod assist (for LE) Sit to supine: Min assist (for LE)   General bed mobility comments: VC throughout for technique  Transfers Overall transfer level: Needs assistance Equipment used: Rolling walker (2 wheeled) Transfers: Sit to/from Stand Sit to Stand: From elevated surface;Min assist            Ambulation/Gait Ambulation/Gait assistance: Min guard Ambulation Distance (Feet): 0 Feet Assistive device: Rolling walker (2 wheeled)       General Gait Details: Noted decreased WB on Lt LE, and inability to progress the Rt leg forward.  Pt was able to side step to the Rt for 2 feet to the [facility].      Balance Overall balance assessment: Needs assistance Sitting-balance support: Feet supported;No upper extremity supported Sitting balance-Leahy Scale: Good     Standing balance support: During functional activity;Bilateral upper extremity supported Standing balance-Leahy Scale: Fair  Pertinent Vitals/Pain Pain Assessment: No/denies pain    Home Living Family/patient expects to be discharged to:: Skilled nursing facility Living Arrangements: Alone               Additional Comments: Pt lives alone in a single story home.  Pt reports he has a friend who is available to assist PRN.  DME : std cane, lift chair (broken)    Prior Function     Gait / Transfers Assistance Needed: Pt reports he was (I) with bed mobility skills, transfers, and household amb.                Extremity/Trunk  Assessment               Lower Extremity Assessment: Generalized weakness;LLE deficits/detail   LLE Deficits / Details: "my bad leg", inability to formally assess with MMT testing      Communication   Communication: No difficulties  Cognition Arousal/Alertness: Awake/alert Behavior During Therapy: WFL for tasks assessed/performed Overall Cognitive Status: No family/caregiver present to determine baseline cognitive functioning                               Assessment/Plan    PT Assessment Patient needs continued PT services  PT Diagnosis Difficulty walking;Generalized weakness   PT Problem List Decreased strength;Decreased activity tolerance;Decreased safety awareness;Decreased balance;Decreased mobility;Decreased knowledge of use of DME  PT Treatment Interventions Balance training;DME instruction;Gait training;Neuromuscular re-education;Functional mobility training;Therapeutic activities;Therapeutic exercise;Patient/family education   PT Goals (Current goals can be found in the Care Plan section) Acute Rehab PT Goals Patient Stated Goal: none stated    Frequency Min 3X/week   Barriers to discharge Inaccessible home environment;Decreased caregiver support         End of Session Equipment Utilized During Treatment: Gait belt Activity Tolerance: Patient limited by fatigue Patient left: in bed;with call bell/phone within reach;with bed alarm set Nurse Communication: Other (comment) (Pt requesting ativan)         Time: 5379-4327 PT Time Calculation (min): 18 min   Charges:   PT Evaluation $Initial PT Evaluation Tier I: 1 Procedure      Marcianne Ozbun 06/13/2014, 4:50 PM

## 2014-06-13 NOTE — Progress Notes (Signed)
Subjective: The patient is lethargic this morning and it is difficult to communicate with him  Objective: Vital signs in last 24 hours: Temp:  [99.2 F (37.3 C)-101.6 F (38.7 C)] 99.2 F (37.3 C) (08/16 2152) Pulse Rate:  [82-106] 82 (08/17 0000) Resp:  [20-21] 20 (08/17 0000) BP: (131-159)/(66-82) 144/66 mmHg (08/17 0000) SpO2:  [97 %-100 %] 100 % (08/17 0000) Weight change:     Intake/Output from previous day: 08/16 0701 - 08/17 0700 In: -  Out: 300 [Urine:300] Intake/Output this shift: Total I/O In: -  Out: 300 [Urine:300]  Physical Exam: General appearance the patient is lethargic H. 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  Weakness.  EXAM: CHEST - 1 VIEW  COMPARISON:  05/29/2014  FINDINGS: Heart size and pulmonary vascularity are normal and the lungs are clear. No effusions. Slight thoracic scoliosis. No acute osseous abnormality.  IMPRESSION: No acute disease.   Electronically Signed   By: Rozetta Nunnery M.D.   On: 06/12/2014 20:46   Dg Lumbar Spine Complete  06/12/2014   CLINICAL DATA:  Back pain.  EXAM: LUMBAR SPINE - COMPLETE 4+ VIEW  COMPARISON:  Radiographs dated 09/08/2013 and 12/11/2009  FINDINGS: There is slight narrowing of the L5-S1 disc space, chronic. The other disc spaces are well maintained. Small anterior osteophytes at L2-3.  There is no fracture or bone destruction.  No facet arthritis.  IMPRESSION: No acute abnormality. Chronic degenerative disc disease  at L5-S1 and to a minimal degree at L2-3   Electronically Signed   By: Rozetta Nunnery M.D.   On: 06/12/2014 20:45    Medications:  . brimonidine  1 drop Left Eye TID  . DULoxetine  60 mg Oral Daily  . enoxaparin (LOVENOX) injection  40 mg Subcutaneous Q24H  . levETIRAcetam  1,000 mg Oral BID  . lisinopril  40 mg Oral Daily  . metFORMIN  1,000 mg Oral BID WC  . pioglitazone  30 mg Oral Daily  . [START ON 06/14/2014] pregabalin  300 mg Oral Daily  . pregabalin  600 mg Oral QHS  . trimethoprim-polymyxin b  1 drop Left Eye 6 times per day    . 0.9 % NaCl with KCl 40 mEq / L 125 mL/hr (06/13/14 0135)     Assessment/Plan: 1. Hyponatremia plan to continue IV normal saline with potassium supplement to repeat be met  2 low serum potassium to continue IV potassium repeat be met  3. Diabetes mellitus type 2 continue Actos and metformin and continue to monitor blood sugars  4. Hypertension essential continue lisinopril   LOS: 1 day   Levi Morris 06/13/2014, 6:16 AM

## 2014-06-13 NOTE — Care Management Note (Addendum)
    Page 1 of 1   06/14/2014     9:33:26 AM CARE MANAGEMENT NOTE 06/14/2014  Patient:  Levi Morris, Levi Morris   Account Number:  [id number]  Date Initiated:  06/13/2014  Documentation initiated by:  Jolene Provost  Subjective/Objective Assessment:   Patient from home. Patient lives alone and has no family in the area. Patient says that he has no HH services or DME's at home. Patient was not independent at home and that is why he is back in the hospital.     Action/Plan:   Patient says he will discharge to "where ever we suggest he go". Patient says he is agreable for SNF, ALF or HH. Will contine to follow for CM needs.   Anticipated DC Date:  06/14/2014   Anticipated DC Plan:  SKILLED NURSING FACILITY  In-house referral  Clinical Social Worker      DC Planning Services  CM consult      Choice offered to / List presented to:             Status of service:  Completed, signed off Medicare Important Message given?   (If response is "NO", the following Medicare IM given date fields will be blank) Date Medicare IM given:   Medicare IM given by:   Date Additional Medicare IM given:   Additional Medicare IM given by:    Discharge Disposition:  [place]  Per UR Regulation:    If discussed at Long Length of Stay Meetings, dates discussed:    Comments:  06/14/2014 0900 Jolene Provost, RN, MSN, PCCN Patient to be discharged to SNF today. CSW to arrange SNF placement. No CM needs at this time.  06/13/2014 [place], RN, MSN, [facility]

## 2014-06-13 NOTE — Progress Notes (Signed)
UR review complete.  

## 2014-06-14 LAB — BASIC METABOLIC PANEL
Anion gap: 12 (ref 5–15)
BUN: 11 mg/dL (ref 6–23)
CO2: 24 mEq/L (ref 19–32)
CREATININE: 0.67 mg/dL (ref 0.50–1.35)
Calcium: 8.7 mg/dL (ref 8.4–10.5)
Chloride: 99 mEq/L (ref 96–112)
GFR calc Af Amer: >90 mL/min (ref >90)
Glucose, Bld: 131 mg/dL — ABNORMAL HIGH (ref 70–99)
Potassium: 4.5 mEq/L (ref 3.7–5.3)
Sodium: 135 mEq/L — ABNORMAL LOW (ref 137–147)

## 2014-06-14 LAB — URINE CULTURE: Colony Count: 8000

## 2014-06-14 NOTE — Clinical Social Work Placement (Signed)
Clinical Social Work Department CLINICAL SOCIAL WORK PLACEMENT NOTE 06/14/2014  Patient:  Levi Morris, Levi Morris  Account Number:  [id number] Admit date:  06/12/2014  Clinical Social Worker:  Benay Pike, LCSW  Date/time:  06/14/2014 08:27 AM  Clinical Social Work is seeking post-discharge placement for this patient at the following level of care:   SKILLED NURSING   (*CSW will update this form in Epic as items are completed)   06/14/2014  Patient/family provided with [place] Department of Clinical Social Work's list of facilities offering this level of care within the geographic area requested by the patient (or if unable, by the patient's family).  06/14/2014  Patient/family informed of their freedom to choose among providers that offer the needed level of care, that participate in Medicare, Medicaid or managed care program needed by the patient, have an available bed and are willing to accept the patient.  06/14/2014  Patient/family informed of [facility]' ownership interest in [facility], as well as of the fact that they are under no obligation to receive care at this facility.  PASARR submitted to EDS on 06/14/2014 PASARR number received on 06/14/2014  FL2 transmitted to all facilities in geographic area requested by pt/family on  06/14/2014 FL2 transmitted to all facilities within larger geographic area on   Patient informed that his/her managed care company has contracts with or will negotiate with  certain facilities, including the following:     Patient/family informed of bed offers received:  06/14/2014 Patient chooses bed at [place] Physician recommends and patient chooses bed at  [place]  Patient to be transferred to [place] on  06/14/2014 Patient to be transferred to facility by facility Lucianne Lei Patient and family notified of transfer on 06/14/2014 Name of family member notified:  Melanie at [facility]. Tried pt's friend, Chrissie Noa  but no voicemail  The following physician request were entered in Epic:   Additional Comments:  Benay Pike, [place]

## 2014-06-14 NOTE — Clinical Social Work Note (Signed)
DSS assessed pt this afternoon. Will decide whether to file protective order later after assessment at [facility] tomorrow. Pt more oriented this afternoon and was agreeable to go to [facility]. Melanie from [place] was present as well. D/C summary faxed. Levada Dy at [facility] reports Josem Kaufmann has been received. Facility to provide transport. CSW attempted to notify pt's friend, Chrissie Noa but message said his phone was not accepting calls at this time. Chrissie Noa was only person pt gave permission to discuss care with yesterday. CSW requested Threasa Beards to try to contact him later.   Benay Pike, [place]

## 2014-06-14 NOTE — Discharge Summary (Signed)
Physician Discharge Summary  Levi Morris WNI:[id number] DOB: [birth year] DOA: 06/12/2014  PCP: Lanette Hampshire, MD  Admit date: 06/12/2014 Discharge date: 06/14/2014     Discharge Diagnoses:  1. Hyponatremia 2. Low serum potassium 3. Diabetes mellitus type 2 4. Hypertension essential 5. Conjunctivitis left eye blind right eye  Discharge Condition: Stable Disposition: Home  Diet recommendation:  Filed Weights   06/13/14 0700  Weight: 95.253 kg (209 lb 15.9 oz)    History of present illness: The patient was brought by EMS to the ED. He does have a prior history of type 2 diabetes hypertension chronic. He was found to have evidence of hyponatremia and low serum potassium emergency department and was started on IV saline with potassium supplement and subsequently was admitted.  Hospital Course:  The patient was continued on IV fluids with supplementary potassium his chemistries didn't improve and normalized. The patient became more alert and expressed his desire to be discharged home. He was continued on medications listed below. He was continued on metformin 1 g twice a day and diabetic diet along with Actos 30 mg daily. Efforts have been made previously to help him to get a better living conditions but he always desires to go home. It was felt that he is stable enough to go home.   Discharge Instructions Patient was instructed to continue medications listed below and to return to primary care physician's office in one week    Medication List    STOP taking these medications       oxyCODONE-acetaminophen 10-325 MG per tablet  Commonly known as:  PERCOCET      TAKE these medications       brimonidine 0.1 % Soln  Commonly known as:  ALPHAGAN P  Place 1 drop into both eyes every 8 (eight) hours.     dorzolamide-timolol 22.3-6.8 MG/ML ophthalmic solution  Commonly known as:  COSOPT  Place 1 drop into the left eye 2 (two) times daily.     DULoxetine 60 MG capsule  Commonly  known as:  CYMBALTA  Take 60 mg by mouth daily.     levETIRAcetam 1000 MG tablet  Commonly known as:  KEPPRA  Take 1 tablet by mouth 2 (two) times daily.     LORazepam 1 MG tablet  Commonly known as:  ATIVAN  Take 1 mg by mouth every 4 (four) hours as needed for anxiety.     metFORMIN 1000 MG tablet  Commonly known as:  GLUCOPHAGE  Take 1 tablet (1,000 mg total) by mouth 2 (two) times daily.     methocarbamol 750 MG tablet  Commonly known as:  ROBAXIN  Take 750-1,500 mg by mouth every 4 (four) hours as needed for muscle spasms. Max 5 tablets daily.     ondansetron 4 MG tablet  Commonly known as:  ZOFRAN  Take 4 mg by mouth every 6 (six) hours as needed for nausea or vomiting.     pioglitazone 30 MG tablet  Commonly known as:  ACTOS  Take 30 mg by mouth daily.     pregabalin 300 MG capsule  Commonly known as:  LYRICA  Take 300-600 mg by mouth 2 (two) times daily. Takes 1 capsule in the morning and 2 capsules in the evening     tiZANidine 4 MG tablet  Commonly known as:  ZANAFLEX  Take 4 mg by mouth 4 (four) times daily.     triamterene-hydrochlorothiazide 37.5-25 MG per tablet  Commonly known as:  MAXZIDE-25  Take 1  tablet by mouth daily.       Allergies  Allergen Reactions  . Poison Oak Extract [Extract Of Poison Oak] Rash    The results of significant diagnostics from this hospitalization (including imaging, microbiology, ancillary and laboratory) are listed below for reference.    Significant Diagnostic Studies: Dg Chest 1 View  06/12/2014   CLINICAL DATA:  Back pain.  Weakness.  EXAM: CHEST - 1 VIEW  COMPARISON:  05/29/2014  FINDINGS: Heart size and pulmonary vascularity are normal and the lungs are clear. No effusions. Slight thoracic scoliosis. No acute osseous abnormality.  IMPRESSION: No acute disease.   Electronically Signed   By: Rozetta Nunnery M.D.   On: 06/12/2014 20:46   Dg Lumbar Spine Complete  06/12/2014   CLINICAL DATA:  Back pain.  EXAM: LUMBAR  SPINE - COMPLETE 4+ VIEW  COMPARISON:  Radiographs dated 09/08/2013 and 12/11/2009  FINDINGS: There is slight narrowing of the L5-S1 disc space, chronic. The other disc spaces are well maintained. Small anterior osteophytes at L2-3.  There is no fracture or bone destruction.  No facet arthritis.  IMPRESSION: No acute abnormality. Chronic degenerative disc disease at L5-S1 and to a minimal degree at L2-3   Electronically Signed   By: Rozetta Nunnery M.D.   On: 06/12/2014 20:45   Dg Abd Acute W/chest  05/29/2014   CLINICAL DATA:  Lower abdominal pain and constipation  EXAM: ACUTE ABDOMEN SERIES (ABDOMEN 2 VIEW & CHEST 1 VIEW)  COMPARISON:  None.  FINDINGS: Cardiac shadow is within normal limits. The lungs are clear bilaterally. Scattered large and small bowel gas is noted. No obstructive changes are seen. No free air is noted. No bony abnormality is seen.  IMPRESSION: No acute abnormality noted.   Electronically Signed   By: Inez Catalina M.D.   On: 05/29/2014 21:56    Microbiology: Recent Results (from the past 240 hour(s))  CULTURE, BLOOD (ROUTINE X 2)     Status: None   Collection Time    06/12/14 10:43 PM      Result Value Ref Range Status   Specimen Description BLOOD RIGHT HAND   Final   Special Requests BOTTLES DRAWN AEROBIC AND ANAEROBIC 12CC   Final   Culture NO GROWTH 1 DAY   Final   Report Status PENDING   Incomplete  CULTURE, BLOOD (ROUTINE X 2)     Status: None   Collection Time    06/12/14 10:43 PM      Result Value Ref Range Status   Specimen Description LEFT ANTECUBITAL   Final   Special Requests BOTTLES DRAWN AEROBIC AND ANAEROBIC 12CC   Final   Culture NO GROWTH 1 DAY   Final   Report Status PENDING   Incomplete     Labs: Basic Metabolic Panel:  Recent Labs Lab 06/12/14 2050 06/13/14 0612  NA 128* 140  K 3.4* 3.9  CL 90* 99  CO2 24 26  GLUCOSE 165* 141*  BUN 10 7  CREATININE 0.66 0.69  CALCIUM 8.5 9.3  MG 1.8  --    Liver Function Tests: No results found for this  basename: AST, ALT, ALKPHOS, BILITOT, PROT, ALBUMIN,  in the last 168 hours No results found for this basename: LIPASE, AMYLASE,  in the last 168 hours No results found for this basename: AMMONIA,  in the last 168 hours CBC:  Recent Labs Lab 06/12/14 2050 06/13/14 0612  WBC 13.6* 11.9*  NEUTROABS 9.9*  --   HGB 11.1* 12.7*  HCT 32.6* 38.1*  MCV 86.2 88.6  PLT 432* 450*   Cardiac Enzymes: No results found for this basename: CKTOTAL, CKMB, CKMBINDEX, TROPONINI,  in the last 168 hours BNP: BNP (last 3 results) No results found for this basename: PROBNP,  in the last 8760 hours CBG:  Recent Labs Lab 06/09/14 2214  GLUCAP 171*    Active Problems:   Hyponatremia   Hypokalemia   Conjunctivitis   Fever   Impaired mobility and ADLs   Time coordinating discharge: 30 minutes Signed:  Marjean Donna, MD 06/14/2014, 6:35 AM

## 2014-06-14 NOTE — Clinical Social Work Note (Signed)
CSW met with pt this morning to discuss PT recommendation of SNF. Pt was sleeping and woke up briefly, but was disoriented, similar to mental status yesterday morning during DSS visit. However, pt became oriented x4 by afternoon. Pt began singing a song this morning when asked where he was. CSW called Melanie at [facility] and she will staff situation with supervisor and call CSW back. Requested bed search in [facility].   Benay Pike, [place]

## 2014-06-14 NOTE — Clinical Social Work Placement (Signed)
Clinical Social Work Department CLINICAL SOCIAL WORK PLACEMENT NOTE 06/14/2014  Patient:  Levi Morris, Levi Morris  Account Number:  [id number] Admit date:  06/12/2014  Clinical Social Worker:  Benay Pike, LCSW  Date/time:  06/14/2014 08:27 AM  Clinical Social Work is seeking post-discharge placement for this patient at the following level of care:   SKILLED NURSING   (*CSW will update this form in Epic as items are completed)   06/14/2014  Patient/family provided with [place] Department of Clinical Social Work's list of facilities offering this level of care within the geographic area requested by the patient (or if unable, by the patient's family).  06/14/2014  Patient/family informed of their freedom to choose among providers that offer the needed level of care, that participate in Medicare, Medicaid or managed care program needed by the patient, have an available bed and are willing to accept the patient.  06/14/2014  Patient/family informed of [facility]' ownership interest in [facility], as well as of the fact that they are under no obligation to receive care at this facility.  PASARR submitted to EDS on 06/14/2014 PASARR number received on 06/14/2014  FL2 transmitted to all facilities in geographic area requested by pt/family on  06/14/2014 FL2 transmitted to all facilities within larger geographic area on   Patient informed that his/her managed care company has contracts with or will negotiate with  certain facilities, including the following:     Patient/family informed of bed offers received:   Patient chooses bed at  Physician recommends and patient chooses bed at    Patient to be transferred to  on   Patient to be transferred to facility by  Patient and family notified of transfer on  Name of family member notified:    The following physician request were entered in Epic:   Additional Comments:  Benay Pike, [place]

## 2014-06-17 LAB — CULTURE, BLOOD (ROUTINE X 2)
Culture: NO GROWTH
Culture: NO GROWTH

## 2014-07-24 ENCOUNTER — Other Ambulatory Visit: Payer: Self-pay

## 2014-07-24 LAB — BASIC METABOLIC PANEL
Anion Gap: 8 (ref 7–16)
BUN: 17 mg/dL (ref 7–18)
CHLORIDE: 101 mmol/L (ref 98–107)
CREATININE: 0.69 mg/dL (ref 0.60–1.30)
Calcium, Total: 8.7 mg/dL (ref 8.5–10.1)
Co2: 22 mmol/L (ref 21–32)
EGFR (African American): >60
GLUCOSE: 211 mg/dL — AB (ref 65–99)
OSMOLALITY: 270 (ref 275–301)
Potassium: 4.3 mmol/L (ref 3.5–5.1)
Sodium: 131 mmol/L — ABNORMAL LOW (ref 136–145)

## 2014-07-24 LAB — CBC WITH DIFFERENTIAL/PLATELET
BASOS ABS: 0.1 10*3/uL (ref 0.0–0.1)
Basophil %: 0.4 %
Eosinophil #: 0 10*3/uL (ref 0.0–0.7)
Eosinophil %: 0.2 %
HCT: 28.5 % — AB (ref 40.0–52.0)
HGB: 9.2 g/dL — AB (ref 13.0–18.0)
LYMPHS ABS: 4 10*3/uL — AB (ref 1.0–3.6)
LYMPHS PCT: 25.4 %
MCH: 28 pg (ref 26.0–34.0)
MCHC: 32.3 g/dL (ref 32.0–36.0)
MCV: 87 fL (ref 80–100)
MONOS PCT: 8.2 %
Monocyte #: 1.3 x10 3/mm — ABNORMAL HIGH (ref 0.2–1.0)
Neutrophil #: 10.3 10*3/uL — ABNORMAL HIGH (ref 1.4–6.5)
Neutrophil %: 65.8 %
PLATELETS: 512 10*3/uL — AB (ref 150–440)
RBC: 3.28 10*6/uL — AB (ref 4.40–5.90)
RDW: 15.6 % — ABNORMAL HIGH (ref 11.5–14.5)
WBC: 15.7 10*3/uL — ABNORMAL HIGH (ref 3.8–10.6)

## 2014-10-28 DEATH — deceased

## 2015-01-27 ENCOUNTER — Ambulatory Visit: Payer: Self-pay | Admitting: Nurse Practitioner

## 2015-12-06 IMAGING — CR DG LUMBAR SPINE COMPLETE 4+V
5 series · 5 of 5 positions shown · non-contrast
Comparison: Radiographs dated 09/08/2013 and 12/11/2009

CLINICAL DATA: Back pain.

EXAM:
LUMBAR SPINE - COMPLETE 4+ VIEW

[view not recorded (1 of 5)]
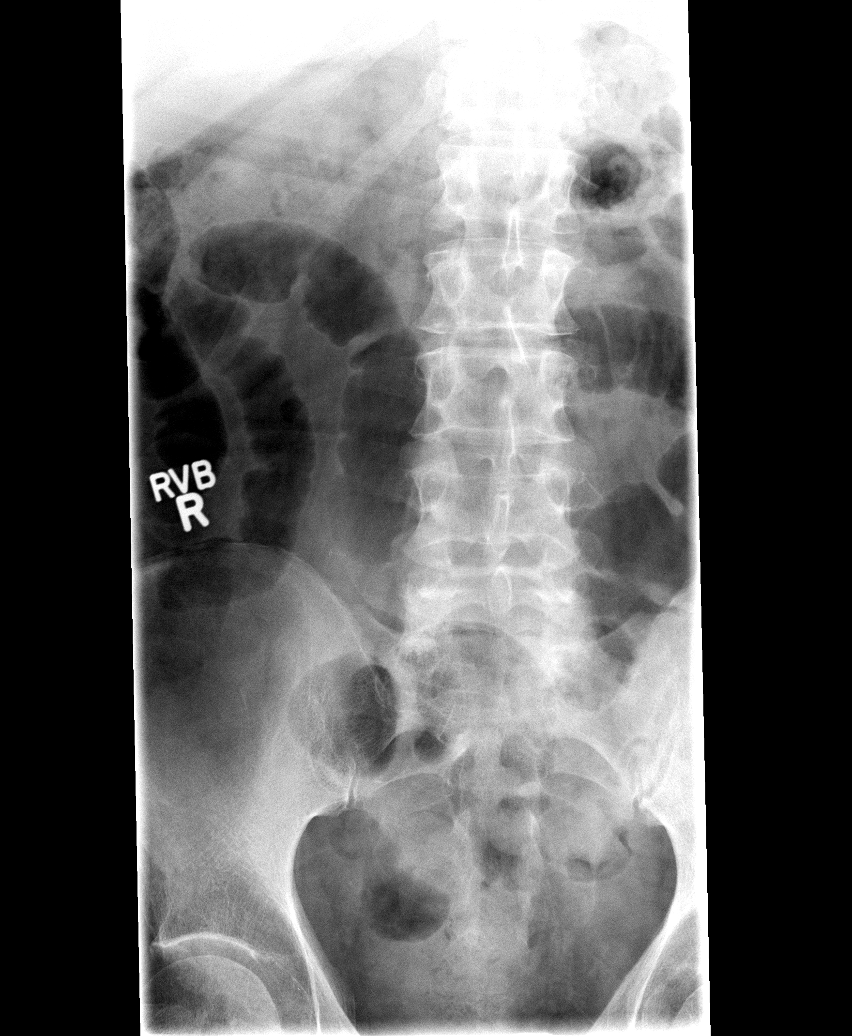

[view not recorded (2 of 5)]
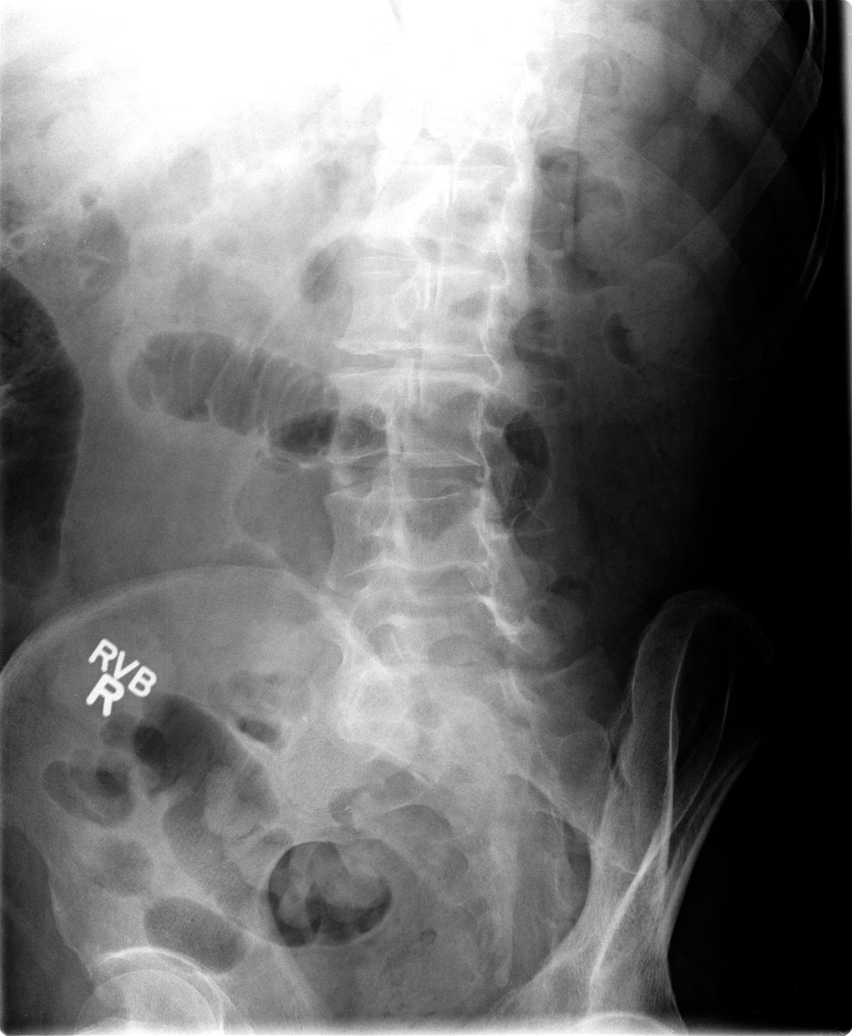

[view not recorded (3 of 5)]
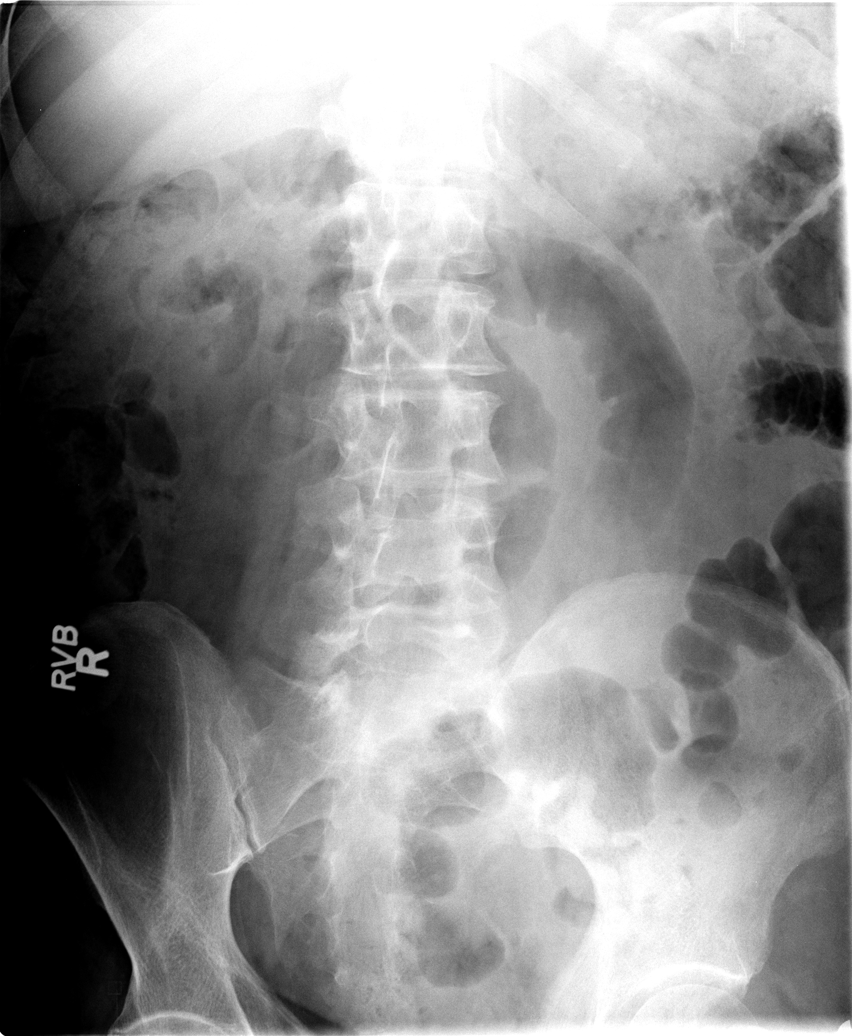

[view not recorded (4 of 5)]
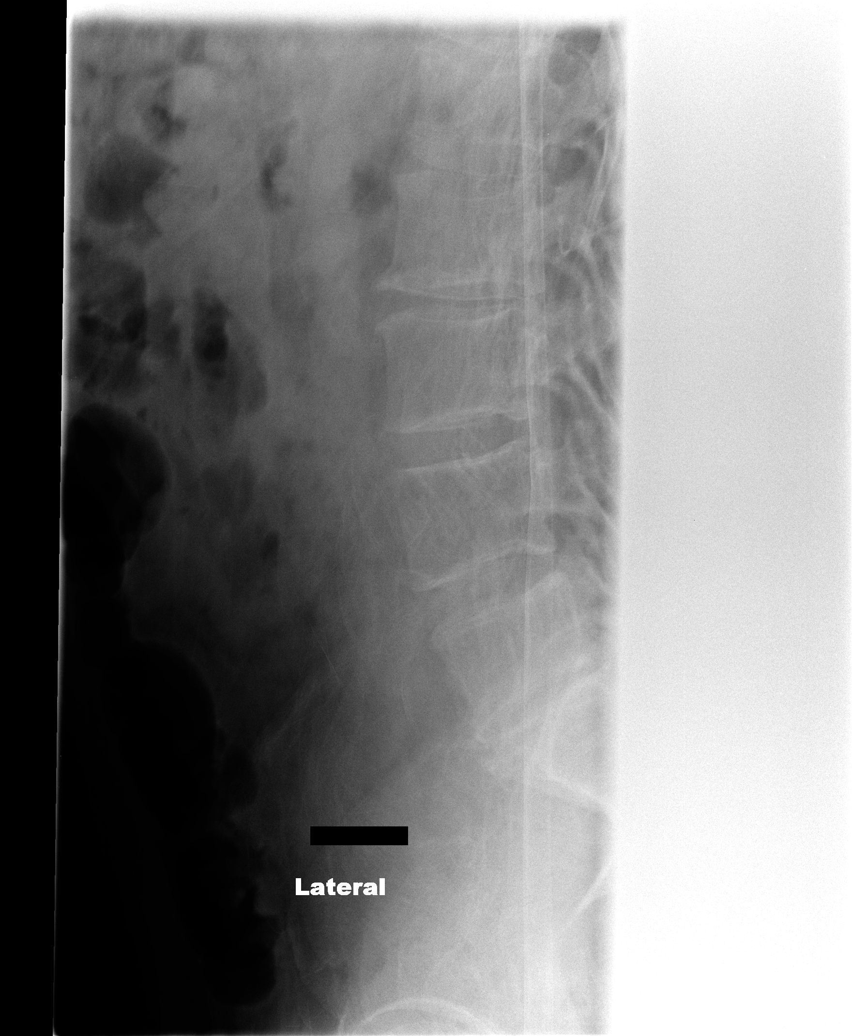

[view not recorded (5 of 5)]
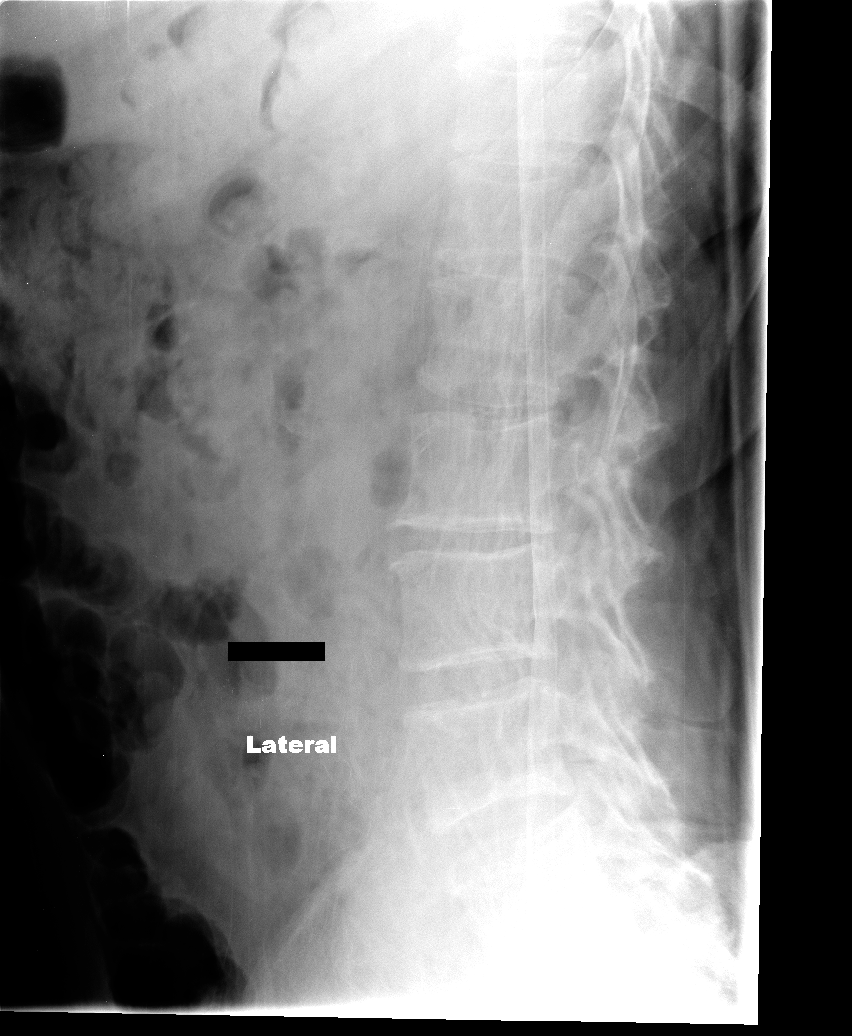

[5 of 5 positions shown; findings below may reference images not displayed]

FINDINGS: There is slight narrowing of the L5-S1 disc space, chronic. The
other disc spaces are well maintained. Small anterior osteophytes at
L2-3.

There is no fracture or bone destruction.  No facet arthritis.
IMPRESSION: No acute abnormality. Chronic degenerative disc disease at L5-S1 and
to a minimal degree at L2-3

## 2020-05-16 NOTE — Progress Notes (Signed)
This encounter was created in error - please disregard.
